# Patient Record
Sex: Female | Born: 1940
Health system: Southern US, Community
[De-identification: ages and names within clinical notes are randomized; demographics above are authoritative.]

## PROBLEM LIST (undated history)

## (undated) DIAGNOSIS — F32A Depression, unspecified: Secondary | ICD-10-CM

## (undated) DIAGNOSIS — E039 Hypothyroidism, unspecified: Secondary | ICD-10-CM

## (undated) DIAGNOSIS — G43109 Migraine with aura, not intractable, without status migrainosus: Secondary | ICD-10-CM

## (undated) DIAGNOSIS — M810 Age-related osteoporosis without current pathological fracture: Secondary | ICD-10-CM

## (undated) DIAGNOSIS — I219 Acute myocardial infarction, unspecified: Secondary | ICD-10-CM

## (undated) DIAGNOSIS — F329 Major depressive disorder, single episode, unspecified: Secondary | ICD-10-CM

## (undated) DIAGNOSIS — B029 Zoster without complications: Secondary | ICD-10-CM

## (undated) DIAGNOSIS — H269 Unspecified cataract: Secondary | ICD-10-CM

## (undated) DIAGNOSIS — E079 Disorder of thyroid, unspecified: Secondary | ICD-10-CM

## (undated) DIAGNOSIS — I2510 Atherosclerotic heart disease of native coronary artery without angina pectoris: Secondary | ICD-10-CM

## (undated) DIAGNOSIS — T7840XA Allergy, unspecified, initial encounter: Secondary | ICD-10-CM

## (undated) DIAGNOSIS — E7800 Pure hypercholesterolemia, unspecified: Secondary | ICD-10-CM

## (undated) DIAGNOSIS — S72009A Fracture of unspecified part of neck of unspecified femur, initial encounter for closed fracture: Secondary | ICD-10-CM

## (undated) HISTORY — DX: Migraine with aura, not intractable, without status migrainosus: G43.109

## (undated) HISTORY — DX: Pure hypercholesterolemia, unspecified: E78.00

## (undated) HISTORY — DX: Allergy, unspecified, initial encounter: T78.40XA

## (undated) HISTORY — DX: Age-related osteoporosis without current pathological fracture: M81.0

## (undated) HISTORY — DX: Zoster without complications: B02.9

## (undated) HISTORY — PX: DILATION AND CURETTAGE OF UTERUS: SHX78

## (undated) HISTORY — PX: OTHER SURGICAL HISTORY: SHX169

## (undated) HISTORY — DX: Atherosclerotic heart disease of native coronary artery without angina pectoris: I25.10

## (undated) HISTORY — DX: Unspecified cataract: H26.9

## (undated) HISTORY — DX: Major depressive disorder, single episode, unspecified: F32.9

## (undated) HISTORY — DX: Depression, unspecified: F32.A

## (undated) HISTORY — DX: Acute myocardial infarction, unspecified: I21.9

## (undated) HISTORY — PX: WISDOM TOOTH EXTRACTION: SHX21

## (undated) HISTORY — DX: Disorder of thyroid, unspecified: E07.9

## (undated) HISTORY — DX: Hypothyroidism, unspecified: E03.9

## (undated) HISTORY — DX: Fracture of unspecified part of neck of unspecified femur, initial encounter for closed fracture: S72.009A

---

## 1998-06-20 ENCOUNTER — Other Ambulatory Visit: Admission: RE | Admit: 1998-06-20 | Discharge: 1998-06-20 | Payer: Self-pay | Admitting: Obstetrics and Gynecology

## 1999-06-18 ENCOUNTER — Other Ambulatory Visit: Admission: RE | Admit: 1999-06-18 | Discharge: 1999-06-18 | Payer: Self-pay | Admitting: Obstetrics and Gynecology

## 2000-06-17 ENCOUNTER — Other Ambulatory Visit: Admission: RE | Admit: 2000-06-17 | Discharge: 2000-06-17 | Payer: Self-pay | Admitting: Obstetrics and Gynecology

## 2001-07-17 ENCOUNTER — Other Ambulatory Visit: Admission: RE | Admit: 2001-07-17 | Discharge: 2001-07-17 | Payer: Self-pay | Admitting: Obstetrics and Gynecology

## 2002-07-30 ENCOUNTER — Other Ambulatory Visit: Admission: RE | Admit: 2002-07-30 | Discharge: 2002-07-30 | Payer: Self-pay | Admitting: Obstetrics and Gynecology

## 2003-08-01 ENCOUNTER — Other Ambulatory Visit: Admission: RE | Admit: 2003-08-01 | Discharge: 2003-08-01 | Payer: Self-pay | Admitting: Obstetrics and Gynecology

## 2004-07-22 ENCOUNTER — Ambulatory Visit (HOSPITAL_COMMUNITY): Admission: RE | Admit: 2004-07-22 | Discharge: 2004-07-22 | Payer: Self-pay | Admitting: Gastroenterology

## 2004-08-05 ENCOUNTER — Other Ambulatory Visit: Admission: RE | Admit: 2004-08-05 | Discharge: 2004-08-05 | Payer: Self-pay | Admitting: *Deleted

## 2004-08-30 HISTORY — PX: CARDIAC CATHETERIZATION: SHX172

## 2004-09-01 ENCOUNTER — Other Ambulatory Visit: Admission: RE | Admit: 2004-09-01 | Discharge: 2004-09-01 | Payer: Self-pay | Admitting: Obstetrics and Gynecology

## 2005-03-30 DIAGNOSIS — I219 Acute myocardial infarction, unspecified: Secondary | ICD-10-CM

## 2005-03-30 HISTORY — DX: Acute myocardial infarction, unspecified: I21.9

## 2006-12-19 ENCOUNTER — Other Ambulatory Visit: Admission: RE | Admit: 2006-12-19 | Discharge: 2006-12-19 | Payer: Self-pay | Admitting: Obstetrics & Gynecology

## 2007-04-29 ENCOUNTER — Ambulatory Visit: Payer: Self-pay | Admitting: *Deleted

## 2007-04-29 ENCOUNTER — Encounter: Payer: Self-pay | Admitting: Emergency Medicine

## 2007-04-30 ENCOUNTER — Ambulatory Visit: Payer: Self-pay | Admitting: Cardiology

## 2007-04-30 ENCOUNTER — Inpatient Hospital Stay (HOSPITAL_COMMUNITY): Admission: EM | Admit: 2007-04-30 | Discharge: 2007-05-05 | Payer: Self-pay | Admitting: Internal Medicine

## 2007-05-04 ENCOUNTER — Encounter: Payer: Self-pay | Admitting: Cardiology

## 2007-05-25 ENCOUNTER — Ambulatory Visit: Payer: Self-pay | Admitting: Cardiology

## 2007-06-01 ENCOUNTER — Encounter (HOSPITAL_COMMUNITY): Admission: RE | Admit: 2007-06-01 | Discharge: 2007-08-30 | Payer: Self-pay | Admitting: Cardiology

## 2007-06-19 ENCOUNTER — Ambulatory Visit: Payer: Self-pay

## 2007-06-19 LAB — CONVERTED CEMR LAB
ALT: 32 units/L (ref 0–35)
AST: 34 units/L (ref 0–37)
Albumin: 4.2 g/dL (ref 3.5–5.2)
HDL: 65.1 mg/dL (ref >39.0)
LDL Cholesterol: 67 mg/dL (ref 0–99)
Lymphocytes Relative: 27.7 % (ref 12.0–46.0)
MCV: 87.8 fL (ref 78.0–100.0)
RDW: 11.6 % (ref 11.5–14.6)
Total Bilirubin: 0.8 mg/dL (ref 0.3–1.2)
Total CHOL/HDL Ratio: 2.2
VLDL: 9 mg/dL (ref 0–40)

## 2007-06-23 ENCOUNTER — Ambulatory Visit: Payer: Self-pay | Admitting: Cardiology

## 2007-07-14 ENCOUNTER — Ambulatory Visit: Payer: Self-pay | Admitting: Cardiology

## 2007-08-18 ENCOUNTER — Ambulatory Visit: Payer: Self-pay | Admitting: Cardiology

## 2007-08-31 ENCOUNTER — Encounter (HOSPITAL_COMMUNITY): Admission: RE | Admit: 2007-08-31 | Discharge: 2007-09-15 | Payer: Self-pay | Admitting: Cardiology

## 2007-12-11 ENCOUNTER — Ambulatory Visit: Payer: Self-pay | Admitting: Cardiology

## 2007-12-20 ENCOUNTER — Other Ambulatory Visit: Admission: RE | Admit: 2007-12-20 | Discharge: 2007-12-20 | Payer: Self-pay | Admitting: Obstetrics and Gynecology

## 2008-01-23 ENCOUNTER — Ambulatory Visit: Payer: Self-pay | Admitting: Cardiology

## 2008-02-16 ENCOUNTER — Ambulatory Visit: Payer: Self-pay | Admitting: Cardiology

## 2008-02-16 LAB — CONVERTED CEMR LAB
AST: 28 units/L (ref 0–37)
Albumin: 3.9 g/dL (ref 3.5–5.2)
Bilirubin, Direct: 0.1 mg/dL (ref 0.0–0.3)
Cholesterol: 141 mg/dL (ref 0–200)
HDL: 61.2 mg/dL (ref >39.0)
LDL Cholesterol: 71 mg/dL (ref 0–99)
Total Bilirubin: 0.8 mg/dL (ref 0.3–1.2)
Total CHOL/HDL Ratio: 2.3
VLDL: 8 mg/dL (ref 0–40)

## 2008-08-02 ENCOUNTER — Ambulatory Visit: Payer: Self-pay | Admitting: Cardiology

## 2008-12-19 ENCOUNTER — Encounter: Admission: RE | Admit: 2008-12-19 | Discharge: 2008-12-19 | Payer: Self-pay | Admitting: Internal Medicine

## 2008-12-23 ENCOUNTER — Other Ambulatory Visit: Admission: RE | Admit: 2008-12-23 | Discharge: 2008-12-23 | Payer: Self-pay | Admitting: Obstetrics & Gynecology

## 2008-12-31 ENCOUNTER — Encounter: Admission: RE | Admit: 2008-12-31 | Discharge: 2008-12-31 | Payer: Self-pay | Admitting: Obstetrics and Gynecology

## 2009-01-30 DIAGNOSIS — E7800 Pure hypercholesterolemia, unspecified: Secondary | ICD-10-CM

## 2009-01-30 DIAGNOSIS — I2510 Atherosclerotic heart disease of native coronary artery without angina pectoris: Secondary | ICD-10-CM | POA: Insufficient documentation

## 2009-01-31 ENCOUNTER — Ambulatory Visit: Payer: Self-pay | Admitting: Cardiology

## 2009-05-06 ENCOUNTER — Ambulatory Visit: Payer: Self-pay | Admitting: Cardiology

## 2009-05-16 LAB — CONVERTED CEMR LAB
ALT: 20 units/L (ref 0–35)
AST: 22 units/L (ref 0–37)
Alkaline Phosphatase: 33 units/L — ABNORMAL LOW (ref 39–117)
Total Bilirubin: 0.7 mg/dL (ref 0.3–1.2)
Total CHOL/HDL Ratio: 3

## 2009-05-19 ENCOUNTER — Telehealth: Payer: Self-pay | Admitting: Cardiology

## 2009-06-30 ENCOUNTER — Ambulatory Visit: Payer: Self-pay | Admitting: Cardiology

## 2009-07-02 ENCOUNTER — Ambulatory Visit: Payer: Self-pay | Admitting: Cardiology

## 2009-07-04 LAB — CONVERTED CEMR LAB
AST: 23 units/L (ref 0–37)
Alkaline Phosphatase: 38 units/L — ABNORMAL LOW (ref 39–117)
Cholesterol: 134 mg/dL (ref 0–200)
HDL: 61.4 mg/dL (ref >39.00)
Total Bilirubin: 0.9 mg/dL (ref 0.3–1.2)
Total Protein: 6.9 g/dL (ref 6.0–8.3)

## 2009-08-14 ENCOUNTER — Ambulatory Visit: Payer: Self-pay | Admitting: Cardiology

## 2009-08-19 LAB — CONVERTED CEMR LAB
ALT: 20 units/L (ref 0–35)
Albumin: 3.9 g/dL (ref 3.5–5.2)
Bilirubin, Direct: 0.1 mg/dL (ref 0.0–0.3)
LDL Cholesterol: 80 mg/dL (ref 0–99)
Total Bilirubin: 0.8 mg/dL (ref 0.3–1.2)
Total CHOL/HDL Ratio: 2
Total Protein: 6.8 g/dL (ref 6.0–8.3)

## 2009-09-08 ENCOUNTER — Encounter: Payer: Self-pay | Admitting: Cardiology

## 2009-12-22 ENCOUNTER — Encounter: Admission: RE | Admit: 2009-12-22 | Discharge: 2009-12-22 | Payer: Self-pay | Admitting: Obstetrics & Gynecology

## 2010-01-05 ENCOUNTER — Ambulatory Visit: Payer: Self-pay | Admitting: Cardiology

## 2010-09-04 ENCOUNTER — Encounter: Payer: Self-pay | Admitting: Cardiology

## 2010-10-01 NOTE — Assessment & Plan Note (Signed)
Summary: f53m   Visit Type:  6 months follow up Primary Provider:  Felipa Eth, MD  CC:  No complains.  History of Present Illness: Doing quite well. .  No chest pain.  Tolerating medications well without difficulty.  LDL at 80 with HDL moderately high.  Options discussed and reviewed in detail.    Current Medications (verified): 1)  Multivitamins  Tabs (Multiple Vitamin) .... Take 1 Tablet By Mouth Once A Day 2)  Calcium For Women 500-100-40 Chew (Calcium-Vitamin D-Vitamin K) .... 3 Tabs Daily 3)  Lexapro 10 Mg Tabs (Escitalopram Oxalate) .... Take One Tablet By Mouth Once Daily. 4)  Vitamin D 2 1.25mg  (50,000) Un .... Take One Every Other Week 5)  Aspirin 81 Mg Tbec (Aspirin) .... Take One Tablet By Mouth Daily 6)  Fish Oil  Oil (Fish Oil) .... Twotsp Daily 7)  Nitroglycerin 0.4 Mg Subl (Nitroglycerin) .... One Tablet Under Tongue Every 5 Minutes As Needed For Chest Pain---May Repeat Times Three 8)  Pravastatin Sodium 20 Mg Tabs (Pravastatin Sodium) .... Take One Tablet By Mouth Daily At Bedtime 9)  Probiotic  Caps (Probiotic Product) .... One Capsule Daily  Allergies: 1)  ! Prednisone 2)  ! Darvon 3)  ! Septra  Vital Signs:  Patient profile:   70 year old female Height:      66 inches Weight:      113.25 pounds BMI:     18.35 Pulse rate:   65 / minute Pulse rhythm:   irregular Resp:     18 per minute BP sitting:   128 / 70  (left arm) Cuff size:   regular  Vitals Entered By: Vikki Ports (Jan 05, 2010 11:34 AM)  Physical Exam  General:  Well developed, well nourished, in no acute distress. Head:  normocephalic and atraumatic Eyes:  PERRLA/EOM intact; conjunctiva and lids normal. Lungs:  Clear bilaterally to auscultation and percussion. Heart:  Normal S1 and S2.  Without murmur or rub.  No gallop.  Extremities:  No clubbing or cyanosis. Neurologic:  Alert and oriented x 3.   EKG  Procedure date:  01/05/2010  Findings:      NSR.  RBBB.  RAE..  Impression &  Recommendations:  Problem # 1:  CAD, UNSPECIFIED SITE (ICD-414.00) stable at current time.  No recurrent symptoms, and doing well.  Continue medical therapy at this point in time.   Her updated medication list for this problem includes:    Aspirin 81 Mg Tbec (Aspirin) .Marland Kitchen... Take one tablet by mouth daily    Nitroglycerin 0.4 Mg Subl (Nitroglycerin) ..... One tablet under tongue every 5 minutes as needed for chest pain---may repeat times three  Orders: EKG w/ Interpretation (93000)  Problem # 2:  HYPERCHOLESTEROLEMIA  IIA (ICD-272.0) LDL at 80mg .  Goals of treatment discussed and reviewed.  Given findings, continue at current dose  (problems with prior statin) Her updated medication list for this problem includes:    Pravastatin Sodium 20 Mg Tabs (Pravastatin sodium) .Marland Kitchen... Take one tablet by mouth daily at bedtime  Orders: EKG w/ Interpretation (93000)  Patient Instructions: 1)  Your physician recommends that you continue on your current medications as directed. Please refer to the Current Medication list given to you today. 2)  Your physician wants you to follow-up in: 1YEAR.  You will receive a reminder letter in the mail two months in advance. If you don't receive a letter, please call our office to schedule the follow-up appointment.

## 2010-10-01 NOTE — Letter (Signed)
Summary: [facility] Office Note  [facility] Office Note   Imported By: Roderic Ovens 10/24/2009 15:19:11  _____________________________________________________________________  External Attachment:    Type:   Image     Comment:   External Document

## 2010-11-27 ENCOUNTER — Other Ambulatory Visit: Payer: Self-pay | Admitting: Certified Nurse Midwife

## 2010-11-27 DIAGNOSIS — Z1231 Encounter for screening mammogram for malignant neoplasm of breast: Secondary | ICD-10-CM

## 2010-12-24 ENCOUNTER — Ambulatory Visit
Admission: RE | Admit: 2010-12-24 | Discharge: 2010-12-24 | Disposition: A | Discharge status: Home / Self Care | Payer: Medicare Other | Source: Ambulatory Visit | Attending: Certified Nurse Midwife | Admitting: Certified Nurse Midwife

## 2010-12-24 DIAGNOSIS — Z1231 Encounter for screening mammogram for malignant neoplasm of breast: Secondary | ICD-10-CM

## 2011-01-12 NOTE — Cardiovascular Report (Signed)
Savannah Freeman, Savannah Freeman              ACCOUNT NO.:  [id number]   MEDICAL RECORD NO.:  [id number]          PATIENT TYPE:  INP   LOCATION:  2915                         FACILITY:  [facility]   PHYSICIAN:  Savannah Morton. 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  She has marked EKG changes in the inferolateral  leads.  The current study was done to assess coronary anatomy.  Risks,  benefits and alternatives were discussed with the patient and her  husband prior to the procedure and she was brought to the  catheterization laboratory for further evaluation.   PROCEDURE:  1. Left heart catheterization.  2. Selective coronary arteriography.  3. Selective left ventriculography.  4. Aortic root aortography.   DESCRIPTION OF PROCEDURE:  The patient was brought to the  catheterization laboratory and prepped and draped in the usual fashion.  Through an anterior puncture the right femoral artery was easily  entered.  A 5-French sheath was placed.  We did give her 1 mg of  intravenous Versed for relaxation.  Following this, views of the left  coronary arteries were obtained in multiple angiographic projections.  Specifically, there was an area of 180-degree bend in the mid vessel  which was carefully looked at.  Multiple views were obtained to try to  lay this out.  The right coronary artery was engaged with a JR-3.5  catheter and a Noto did not fit well.  Central aortic and left  ventricular pressures were measured with a pigtail.  Ventriculography  was performed in the RAO projection.  Following a pressure pullback,  proximal root aortography was performed as well.  She tolerated the  procedure well and was taken to the holding area in satisfactory  clinical condition.  An ACT was checked to make sure that  sheath removal  was feasible.   I then reviewed the films with the patient's husband in the laboratory.  There were no complications.   HEMODYNAMIC DATA:  1. Central aortic pressure 127/69, mean 94.  2. Left ventricular pressure 129/23.  3. No gradient on pullback across aortic valve.   ANGIOGRAPHIC DATA:  1. On plain fluoroscopy there was no evidence of significant      calcification.  2. Ventriculography was done in the RAO projection.  There was a      anteroapical and distal inferior wall motion abnormality.  This      could represent a takotsubo, or could be related to the distal LAD.      Overall ejection fraction would be estimated in the 40% range.  3. The aortic root does not demonstrate evidence of aortic      regurgitation or significant aortic dilatation.  4. The left main is free of critical disease.  5. The left anterior descending artery demonstrates about a 10-20%      area of narrowing at the proximal aspect.  In the distal vessel      well beyond the  diagonal there is an area of 50-60% narrowing and      this occurs at a location of a 180-degree bend in the artery.  This      could represent potentially a ruptured plaque but we tried to lay      it out in multiple views and in the best views it did not appear to      be disrupted.  The distal LAD wrapped the apex and provided the      distal inferior wall.  6. The circumflex was a relatively small system that appeared to be      free of significant disease.  7. The right coronary artery is relatively small system that also      appeared to be free of significant disease.   CONCLUSIONS:  1. Moderate reduction in left ventricular function with an      anteroapical distal inferior wall motion abnormality.  2. Overall smooth coronary vessels with abnormality in the distal left      anterior descending artery as noted above.   DISCUSSION:  The distal LAD demonstrates an area of 180-degree bend.  There is marked  tortuosity at a location that could represent a 50-60%  area of focal narrowing.  In the best views, this does not appear to be  critical or hazy.  It is foreshortened in more of the LAO views.  In  either situation this could be treated as takotsubo, or could in fact be  treated as a distal LAD ruptured plaque.  The LAD lesion clearly is not  favorable for percutaneous intervention.  A medical approach to therapy  initially would be recommended.  I will review this with my colleagues.      Savannah Morton. Riley Kill, MD, [facility]  Electronically Signed     TDS/MEDQ  D:  05/02/2007  T:  05/02/2007  Job:  [id number]   cc:   Larina Earthly, M.D.  Rollene Rotunda, MD, [facility]  CV Laboratory

## 2011-01-12 NOTE — Procedures (Signed)
[name]                              EXERCISE TREADMILL   [name], [name]                     MRN:          [id number]  DATE:06/19/2007                            DOB:          1941-03-28    Savannah Freeman on the Bruce protocol.  Overall exercise tolerance  was fairly good.  She completed 8 minutes and 4 seconds.  She  experienced no chest pain.  The test was terminated due to fatigue.   Her electrocardiogram demonstrated a normal sinus rhythm.  There were  fairly permanent P waves.  At maximum stress, we did not appreciate any  significant ST depression.  The study from a standpoint of ischemia was  negative.  There were no definite ST segment changes.  It was difficult  to measure her blood pressure and it really varied during the course of  the procedure with a somewhat flat response.   RISK SUMMATION:  The patient was admitted with a periapical myocardial  infarction.  She had some irregularity in the LAD, but Dr. Excell Seltzer and I  reviewed the films in detail, did not really feel that this looked like  a ruptured plaque.  The ventriculogram also had the appearance of a Tako-Tsubo.  She has  been treated medically.  She could not tolerate Plavix because of a nosebleed.  Currently, echocardiography has been repeated and her overall ejection  fraction is normal at 60%.  Dr. Myrtis Ser and I have reviewed the films  together.  At the present time, her main symptom has been modest shortness of  breath with exertion.  Importantly, her oxygen saturation throughout the  course of the test was normal.  She is on a minimal dose of Toprol.   We plan to get a hemoglobin to make sure that she is not anemic.  Her  echocardiogram also does not show RV enlargement and as noted the  saturation remained at 99-100 throughout the course of the exercise.  We  will check a  CBC and I will see her back in followup in a month.  A  lipid profile will also be  obtained.  She will remain in cardiac rehab.  She also was noted to be a little bit orthostatic in cardiac rehab and I  have reminded her that it is important to remain well hydrated before  and during the rehab process.     Arturo Morton. Riley Kill, MD, [facility]  Electronically Signed    TDS/MedQ  DD: 06/19/2007  DT: 06/19/2007  Job #: [id number]

## 2011-01-12 NOTE — Assessment & Plan Note (Signed)
[facility]                            CARDIOLOGY OFFICE NOTE   Savannah Freeman, Savannah Freeman                     MRN:          [id number]  DATE:12/11/2007                            DOB:          1940-11-10    Savannah Freeman is in for a followup visit.  She is doing really well.  The  major complaint is that of fatigue and tiredness.  She is not sleeping  well at night, and still continues to have some issues at home.  She  denies chest pain.   PHYSICAL:  Blood pressure is 120/70, pulse 58.  The lung fields are clear.  CARDIAC:  Rhythm is regular without significant murmur.  EXTREMITIES:  Reveal no edema.   EKG reveals sinus bradycardia with first-degree AV block, right atrial  enlargement, indeterminate axis, and incomplete right bundle.  The main  finding compared to previous tracings is that the T-wave inversion,  which was dramatic early on, has completely resolved.  She does have  prominent P-waves, but should be noted that the patient only weighs 108  pounds and has a very thin chest wall.   She and I have had a long ranging discussion today.  To summarize, she  is agreeable to cutting her Toprol in half from 25 mg today to 12-1/2 mg  daily with a first-degree AV block, and bradycardia, my hope is that  this will improve some of her symptoms.  She is also not sleeping well  at night.  She has tolerated the statins, and does not have specific  side effects, but she has some fear based and talking to other patients.  I will see her back in follow up soon, in 6 weeks, and we will readdress  her situation at that time.     Arturo Morton. Riley Kill, MD, [facility]  Electronically Signed    TDS/MedQ  DD: 12/11/2007  DT: 12/11/2007  Job #: [id number]

## 2011-01-12 NOTE — Assessment & Plan Note (Signed)
[facility]                            CARDIOLOGY OFFICE NOTE   NATAHLIA, HOGGARD                     MRN:          [id number]  DATE:05/25/2007                            DOB:          Apr 02, 1941    Ms. Keilman is in for followup. She recently presented to the hospital  with chest discomfort. She turned out to have fairly marked EKG changes  and positive enzymes. At catheterization, she had an LAD area distal  that was a 360 degree bend. This did not look all that tight and Dr.  Excell Seltzer and I thought based on the wall motion abnormality this likely  would represent takotsubo. She has been under a lot of marital stress  recently. She denies any exertional chest pain prior to this. She also  has a history of migraines __________ . The patient has done well since  discharge from the hospital although there has been a modest amount of  fatigue.   On examination today the blood pressure is 122/81, the pulse is 64.  LUNG FIELDS: Clear.  CARDIAC: Rhythm is regular. There is not a significant murmur.   The electrocardiogram demonstrates normal sinus rhythm. There is T wave  inversion in V3 through V6, as well as 2, 3, and aVF and slightly  prominent P waves are noted.   We reviewed her films in the office today. Left ventricular function was  down with this apical wall motion abnormality. In addition, we did do an  aortic route to make sure she did not have an anomalus circumflex  vessel.   The patient is doing well on a medical regimen. She will continue on:  1. Toprol XL 25 mg.  2. Isosorbide mononitrate.  She also remains on aspirin. She did develop a nose bleed on Plavix and  this has been held. She also stopped simvastatin because of what she  described as ringing in the ears, but I have encouraged her to resume  this at 20 mg daily. I plan to see her back in follow up in 2 weeks at  which time an exercise treadmill study will be  performed.     Arturo Morton. Riley Kill, MD, [facility]  Electronically Signed    TDS/MedQ  DD: 05/25/2007  DT: 05/25/2007  Job #: [id number]

## 2011-01-12 NOTE — Letter (Signed)
Jan 23, 2008    Larina Earthly, M.D.  [address]  [place], [place] [address]   RE:  [name], [name]  MRN:  [id number]  /  DOB:  November 21, 1940   Dear Dr. Felipa Eth:   I had the pleasure of seeing Savannah Freeman in the office today in  follow-up.  She is scheduled to go to [place] in the next couple of  weeks.  Cardiac-wise, she seems to be getting along extremely well.   MEDICATIONS:  1. Enteric-coated aspirin 81 mg daily.  2. Multivitamin daily.  3. Simvastatin 20 mg daily.  4. Fish oil daily.  5. Calcium 600 mg b.i.d.  6. Toprol XL 25 mg 1/2 tablet daily.  7. Vitamin D 50,000 international units weekly.   On physical, she is alert and oriented in no distress.  Blood pressure  is 134/76, pulse is 62.  The lung fields are clear.  The cardiac rhythm  is regular.   Her electrocardiogram demonstrates normal sinus rhythm.  There are  prominent P-waves suggesting a pulmonary pattern, but importantly the  patient has a fairly good looking echocardiogram.  As a correlate, she  is quite thin.   I am quite pleased with how she is doing.  I hope she will do well in  [place], and I do not have any reason to believe otherwise.  We plan to  see her back in follow-up in 6 months and will get a lipid and liver  profile at some point.  Thanks for allowing me to share in her care.   ADDENDUM:  EKG reveals normal sinus rhythm and there are prominent P-  waves, suggesting right atrial enlargement.  This may be due to body  habitus.    Sincerely,      Arturo Morton. Riley Kill, MD, [facility]  Electronically Signed    TDS/MedQ  DD: 01/23/2008  DT: 01/23/2008  Job #: [id number]

## 2011-01-12 NOTE — H&P (Signed)
NAMEANNSLEE, TERCERO              ACCOUNT NO.:  [id number]   MEDICAL RECORD NO.:  [id number]          PATIENT TYPE:  EMS   LOCATION:  ED                           [facility]ACILITY:  [facility]   PHYSICIAN:  Rod Holler, MD     DATE OF BIRTH:  1940-12-22   DATE OF ADMISSION:  04/29/2007  DATE OF DISCHARGE:                              HISTORY & PHYSICAL   CHIEF COMPLAINT:  Chest pain.   HISTORY OF PRESENT ILLNESS:  Ms. Sibrian is a very pleasant 70 year old  female with no significant past medical history who presented to the  emergency department with complaints of chest discomfort.  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  Later in the evening, there was some  radiation to her bilateral shoulders.  She had no associated shortness  of breath, nausea or diaphoresis.  The discomfort improved with rest and  improved more with morphine which was given in the emergency department.  The chest discomfort did not improve with sublingual nitroglycerin x3.  The patient has never had chest discomfort before.  She is a fairly  active lady.  She had no complaints of PND, orthopnea, no lower  extremity swelling, no syncope or presyncope, no palpitations.  In the  emergency department, the patient was given sublingual nitroglycerin,  aspirin and morphine.   PAST MEDICAL HISTORY:  None.   MEDICATIONS:  1. Aspirin 81 mg p.o. daily.  2. Calcium.  3. Fish oil.   ALLERGIES:  1. DARVON.  2. PREDNISONE.  3. SEPTRA.   SOCIAL HISTORY:  The patient has a remote smoking history, is married.   FAMILY HISTORY:  Mother with history of coronary artery disease and a  stroke in her 85s.   REVIEW OF SYSTEMS:  All systems reviewed in detail and are negative  except as noted in the history of present illness.   PHYSICAL EXAMINATION:  VITAL SIGNS:  Temperature 97.3.  Heart rate in  the 60s.  Respiratory rate 17.  Temperature 98.4.  __________ Initial  blood pressure 163/98, repeat blood pressure 116/73.  GENERAL:  Thin female, alert and oriented x3.  No apparent distress.  HEENT:  Atraumatic, normocephalic.  Pupils equally round and reactive to  light and accommodation.  Extraocular movements intact.  Oropharynx  clear.  NECK:  Supple.  No adenopathy, no JVD, no carotid bruits.  CHEST:  Lungs clear to auscultation bilaterally with equal bilateral  breath sounds.  CARDIOVASCULAR:  Regular rhythm with a normal rate, normal S1, S2.  No  murmurs, rubs or gallops, 2+ peripheral pulses.  ABDOMEN:  Soft, nontender, nondistended.  Active bowel sounds.  EXTREMITIES:  No clubbing, cyanosis or edema.  NEUROLOGIC:  No focal deficits.   LABORATORY DATA:  White blood cell count 8.5, hematocrit 40.3, platelet  count 319, sodium 138, potassium 4.2, chloride 103, bicarb 26, BUN 13,  creatinine 0.7, glucose 110, total bilirubin 0.9, alk phos 44, SGOT 31,  SGPT 25, total protein 10.3, albumin 4.3, calcium 9.9, D-dimer less than  0.22, CK-MB  of 2, troponin 0.08, Myoglobin 108.  EKG shows normal sinus  rhythm with nonspecific ST changes.  No previous EKG available.   IMPRESSION AND PLAN:  A 70 year old female with no medical problems who  presents with chest pain syndrome, elevated initial troponin, symptoms  worrisome for acute coronary syndrome.   PLAN:  1. Cardiovascular.  Admit the patient to [facility] telemetry bed,      aspirin daily, Lipitor daily, ACE inhibitor, beta blocker b.i.d.,      we will start heparin bolus and drip per the pharmacy protocol,      serial cardiac enzymes daily, EKG, blood panel and BNP in the      morning.  2. Pulmonary.  Follow up chest x-ray.  3. Fluids, electrolytes and nutrition:  Cardiac diet, BMP and      magnesium level in the morning.  4. GI.  Guaiac all stools.      Rod Holler, MD  Electronically Signed     TRK/MEDQ  D:  04/29/2007  T:  04/30/2007  Job:  [id number]

## 2011-01-12 NOTE — Assessment & Plan Note (Signed)
[facility]                            CARDIOLOGY OFFICE NOTE   JOHNDA, BILLIOT                     MRN:          [id number]  DATE:08/18/2007                            DOB:          June 06, 1941    Amberle returns today in followup.  In general she is doing quite well.  We discontinued her isosorbide mononitrate recently.  Since that time  she has had no migraines and no significant chest pain, her shortness of  breath has substantially improved and she does play wind instruments so  she has noticed a substantial recovery.  She said she is back towards  normal now, and is using coffee in the morning, she has had some  terrible allergies.   PHYSICAL:  The blood pressure is 120/68, the pulse is 64.  LUNGS:  Fields are clear.  CARDIAC:  Rhythm is regular.   IMPRESSION:  1. History of mild coronary artery plaquing with concern for possible      Tako-Tsubo presentation as well, with eventual recovery of overall      left ventricular function.  2. Hypercholesterolemia on lipid lowering therapy.   PLAN:  1. Return to clinic in 4 months.  2. Continue current medical regimens.   EKG today reveals normal sinus rhythm, possible left atrial enlargement  and nonspecific T wave abnormality.     Arturo Morton. Riley Kill, MD, [facility]  Electronically Signed    TDS/MedQ  DD: 08/18/2007  DT: 08/19/2007  Job #: [id number]

## 2011-01-12 NOTE — Discharge Summary (Signed)
NAMESHANICE, Savannah Freeman              ACCOUNT NO.:  [id number]   MEDICAL RECORD NO.:  [id number]          PATIENT TYPE:  INP   LOCATION:  2027                         FACILITY:  [facility]   PHYSICIAN:  Nicolasa Ducking, ANP DATE OF BIRTH:  01/14/1941   DATE OF ADMISSION:  04/30/2007  DATE OF DISCHARGE:  05/05/2007                               DISCHARGE SUMMARY   PRIMARY CARDIOLOGIST:  Arturo Morton. Riley Kill, MD, [facility]   DISCHARGE DIAGNOSIS:  A non-ST-elevation myocardial infarction.   SECONDARY DIAGNOSES:  1. Takotsubo's cardiomyopathy.  2. Hyperlipidemia.  3. Epistaxis.   ALLERGIES:  SULFA, DARVON, PREDNISONE.   PROCEDURES:  Left heart cardiac catheterization and 2-D echocardiogram.   HISTORY OF PRESENT ILLNESS:  This is a 70 year old Caucasian female  without any significant past medical history.  She has been having some  marital difficulties which have caused a lot of in her life and was in  church playing the flute when she had sudden onset of chest pain and  pressure with radiation to bilateral shoulders without associated  symptoms.  Symptoms improved some with rest.  She presented to the  [facility] ED where she was treated with sublingual  nitroglycerin, aspirin, and morphine with significant improvement in  symptoms.  Her EKG showed nonspecific ST changes and her first set of  cardiac markers were abnormal with a CK of 94 and MB of 8, and troponin-  I of 1.94.  She was admitted to CCU for management of non-ST-elevation  MI.   HOSPITAL COURSE:  The patient peaked her MB at 11.0.  CKs remained  normal.  Troponins trended down.  She underwent left heart cardiac  catheterization on September 2 which revealed a 50-60% stenosis in the  mid LAD and otherwise nonobstructive coronary artery disease.  And  apical wall motion abnormality was noted on left ventriculography and  there was a suspicion for Takotsuto's  cardiomyopathy versus vasospasm  in the LAD.  The patient was  treated with aspirin, Plavix and Integrilin  as well as beta blocker, ACE inhibitor and statin therapy.  Unfortunately, she developed epistaxis on Integrilin and this was  discontinued.  She continued to have intermittent epistaxis and the ENT  was consulted.  Her Plavix ended up being discontinued with resolution  of epistaxis.  A followup 2-D echocardiogram was performed on September  4, and this showed an EF of 35-40% with periapical akinesis, mild  diastolic dysfunction, mild AI and a small pericardial effusion.  Dr.  Riley Kill re-reviewed the films with Dr. Tonny Bollman, and it was felt  that most likely [name]'s presentation was consistent with  Takotsubo's cardiomyopathy rather than coronary vasospasm.  She has been  ambulating in the hallways without recurrent discomfort or dyspnea.  She  has undergone education with regards to the diagnosis Takotsubo's  cardiomyopathy as well as congestive heart failure which she has not  exhibited during this admission.  She will be discharged home today in  satisfactory condition.   DISCHARGE LABORATORY DATA:  Hemoglobin 11.1, hematocrit 32.2, WBC 0.4,  platelets 224,000, MCV 87.2.  Sodium 140, potassium 2.4, chloride  110,  CO2 28, BUN 11, creatinine 0.72, glucose 97, total bilirubin 0.9,  alkaline phosphatase 44, AST 31, ALT 25, albumin 4.3, CK 107, troponin-I  0.54, total cholesterol 162, triglycerides 44, HDL 63, LDL 90, calcium  8.9, magnesium 2.2.  TSH 2.668.  BNP 651.  D-dimer was less than 0.22.   DISPOSITION:  The patient is being discharged home today in good  condition.   FOLLOW-UP APPOINTMENTS:  She has followup with Dr. Shawnie Pons on  September 25 at 4:15 p.m..   DISCHARGE MEDICATIONS:  1. Aspirin 81 mg daily.  2. Lisinopril 2.5 mg daily.  3. Toprol XL 25 mg daily.  4. Simvastatin 4 mg at bedtime.  5. Multivitamin one daily.  6. Calcium as previously prescribed.  7. Nitroglycerin 0.4 mg sublingual p.r.n. chest  pain.   STUDIES:  None.   Duration discharge encounter:  Sixty minutes including physician time.      Nicolasa Ducking, ANP     CB/MEDQ  D:  05/05/2007  T:  05/05/2007  Job:  [id number]

## 2011-01-15 ENCOUNTER — Other Ambulatory Visit: Payer: Self-pay | Admitting: Obstetrics & Gynecology

## 2011-01-15 DIAGNOSIS — Z780 Asymptomatic menopausal state: Secondary | ICD-10-CM

## 2011-01-15 NOTE — Assessment & Plan Note (Signed)
[facility]                            CARDIOLOGY OFFICE NOTE   [name], [name]                     MRN:          [id number]  DATE:08/05/2008                            DOB:          1940-12-19    Savannah Freeman in for followup.  In general, she really is doing quite well.  She  sees a therapist who thinks that she might benefit from an  antidepressant.  I encouraged her to talk to her primary care physician  about this.  She denies any cardiac symptoms at the present time and  denies any chest pain.   Her current medications include:  1. Enteric-coated aspirin 81 mg daily.  2. Multivitamin daily.  3. Simvastatin 20 mg daily.  4. Fish oil daily.  5. Calcium b.i.d.  6. Toprol-XL 25 mg one-fourth tablet daily.  7. Vitamin D two 50,000 international units per week.   On physical, she is alert and oriented.  No distress.  The blood  pressure is 134/84, the pulse is 64.  The lung fields are clear.  The  weight is 113 pounds.  Cardiac rhythm is regular.  No definite murmur is  appreciated.   The electrocardiogram demonstrates sinus rhythm with borderline first-  degree AV block.  There is an incomplete right bundle morphology.   IMPRESSION:  1. Coronary artery disease status post acute coronary syndrome to the      distal left anterior descending abnormality.  2. Hypercholesterolemia on lipid-lowering therapy.     Arturo Morton. Riley Kill, MD, [facility]  Electronically Signed    TDS/MedQ  DD: 08/05/2008  DT: 08/05/2008  Job #: [id number]

## 2011-01-15 NOTE — Op Note (Signed)
Savannah Freeman, ROPER              ACCOUNT NO.:  [id number]   MEDICAL RECORD NO.:  [id number]          PATIENT TYPE:  AMB   LOCATION:  ENDO                         FACILITY:  [facility]   PHYSICIAN:  Anselmo Rod, M.D.  DATE OF BIRTH:  02/09/1941   DATE OF PROCEDURE:  07/22/2004  DATE OF DISCHARGE:                                 OPERATIVE REPORT   PROCEDURE PERFORMED:  Screening colonoscopy.   ENDOSCOPIST:  Charna Elizabeth, M.D.   INSTRUMENT USED:  Olympus video colonoscope.   INDICATIONS FOR PROCEDURE:  The patient is a 70 year old white female  undergoing screening colonoscopy to rule out colonic polyps, masses, etc.   PREPROCEDURE PREPARATION:  Informed consent was procured from the patient.  The patient was fasted for eight hours prior to the procedure and prepped  with a bottle of magnesium citrate and a gallon of GoLYTELY the night prior  to the procedure.       A 10% miss rate for colon polyps or cancers was  discussed with the patient.   PREPROCEDURE PHYSICAL:  The patient had stable vital signs.  Neck supple.  Chest clear to auscultation.  S1 and S2 regular.  Abdomen soft with normal  bowel sounds.   DICTATION ENDED HERE.       JNM/MEDQ  D:  07/23/2004  T:  07/23/2004  Job:  [id number]   cc:   Laqueta Linden, M.D.  [address]  [place]  [place] [address]  Fax: [phone]   Larina Earthly, M.D.  [address]  [place]  [place] [address]  Fax: [phone]

## 2011-01-15 NOTE — Op Note (Signed)
Savannah Freeman, Savannah Freeman              ACCOUNT NO.:  [id number]   MEDICAL RECORD NO.:  [id number]          PATIENT TYPE:  AMB   LOCATION:  ENDO                         FACILITY:  [facility]   PHYSICIAN:  Anselmo Rod, M.D.  DATE OF BIRTH:  [birth year]   DATE OF PROCEDURE:  07/23/2004  DATE OF DISCHARGE:                                 OPERATIVE REPORT   PROCEDURE PERFORMED:  Screening colonoscopy.   ENDOSCOPIST:  Charna Elizabeth, M.D.   INSTRUMENT USED:  Olympus video colonoscope.   INDICATIONS FOR PROCEDURE:  A 70 year old white female undergoing screening  colonoscopy to rule out colonic polyps, masses, etc.   PREPROCEDURE PREPARATION:  Informed consent was procured from the patient.  The patient was fasted for eight hours prior to the procedure and prepped  with a bottle of magnesium citrate and a gallon of GoLYTELY the night prior  to the procedure.       A 10% miss rate for colon polyps or cancers was  discussed with the patient.   PREPROCEDURE PHYSICAL:  The patient had stable vital signs.  Neck supple.  Chest clear to auscultation.  S1 and S2 regular.  Abdomen soft with normal  bowel sounds.   DESCRIPTION OF PROCEDURE:  The patient was placed in left lateral decubitus  position and sedated with 60 mg of Demerol and 7.5 mg of Versed in slow  incremental doses.  Once the patient was adequately sedated and maintained  on low flow oxygen and continuous cardiac monitoring, the Olympus video  colonoscope was advanced from the rectum to the cecum.  The appendicular  orifice and ileocecal valve were clearly visualized and photographed.  No  masses, polyps, erosions, ulcerations or diverticula were seen.  Retroflexion in the rectum revealed no abnormalities.   IMPRESSION:  Normal colonoscopy up to the cecum.  No masses, polyps or  diverticula seen.   RECOMMENDATIONS:  1.  Continue high fiber diet with liberal fluid intake.  2.  Repeat colonoscopy is recommended in the next five years  unless the      patient develops any abnormal symptoms in the interim.  3.  Outpatient followup as need arises in the future.       JNM/MEDQ  D:  07/23/2004  T:  07/23/2004  Job:  [id number]   cc:   Laqueta Linden, M.D.  [address]  [place]  [place] [address]  Fax: [phone]   Larina Earthly, M.D.  [address]  [place]  [place] [address]  Fax: [phone]

## 2011-01-21 ENCOUNTER — Encounter: Payer: Self-pay | Admitting: Cardiology

## 2011-01-22 ENCOUNTER — Ambulatory Visit
Admission: RE | Admit: 2011-01-22 | Discharge: 2011-01-22 | Disposition: A | Discharge status: Home / Self Care | Payer: Medicare Other | Source: Ambulatory Visit | Attending: Obstetrics & Gynecology | Admitting: Obstetrics & Gynecology

## 2011-01-22 DIAGNOSIS — Z780 Asymptomatic menopausal state: Secondary | ICD-10-CM

## 2011-01-28 ENCOUNTER — Ambulatory Visit (INDEPENDENT_AMBULATORY_CARE_PROVIDER_SITE_OTHER): Payer: Medicare Other | Admitting: Cardiology

## 2011-01-28 ENCOUNTER — Encounter: Payer: Self-pay | Admitting: Cardiology

## 2011-01-28 DIAGNOSIS — E7800 Pure hypercholesterolemia, unspecified: Secondary | ICD-10-CM

## 2011-01-28 DIAGNOSIS — I2510 Atherosclerotic heart disease of native coronary artery without angina pectoris: Secondary | ICD-10-CM

## 2011-01-28 NOTE — Assessment & Plan Note (Signed)
Followed by Dr. Avva  

## 2011-01-28 NOTE — Assessment & Plan Note (Signed)
Completely reviewed with patient. She is doing well.  I favor continuing current medications.  We can see her again in one year.

## 2011-01-28 NOTE — Progress Notes (Signed)
HPI:  Doing very well.  No complaints.  Prior history reviewed.  No problems.  Feeling good.    Current Outpatient Prescriptions  Medication Sig Dispense Refill  . aspirin 81 MG tablet Take 81 mg by mouth daily.        . Calcium-Vitamin D-Vitamin K (CALCIUM FOR WOMEN) 500-100-40 MG-UNT-MCG CHEW Chew 3 tablets by mouth daily.        Marland Kitchen escitalopram (LEXAPRO) 10 MG tablet Take 10 mg by mouth daily.        . Fish Oil-Canola Oil-Vit D3 LIQD Take 2 mLs by mouth daily.        . Multiple Vitamin (MULTIVITAMIN) tablet Take 1 tablet by mouth daily.        . nitroGLYCERIN (NITROSTAT) 0.4 MG SL tablet Place 0.4 mg under the tongue every 5 (five) minutes as needed.        . pravastatin (PRAVACHOL) 20 MG tablet Take 20 mg by mouth daily.        . Probiotic Product (PROBIOTIC PO) Take by mouth daily.        . Vitamin D, Ergocalciferol, (DRISDOL) 50000 UNITS CAPS Take 50,000 Units by mouth every 14 (fourteen) days.          Allergies  Allergen Reactions  . Prednisone   . Propoxyphene Hcl   . Sulfamethoxazole W/Trimethoprim     Past Medical History  Diagnosis Date  . CAD (coronary artery disease)   . Hypercholesteremia     No past surgical history on file.  Family History  Problem Relation Age of Onset  . Coronary artery disease    . Stroke      History   Social History  . Marital Status: Married    Spouse Name: N/A    Number of Children: N/A  . Years of Education: N/A   Occupational History  . Not on file.   Social History Main Topics  . Smoking status: Former Games developer  . Smokeless tobacco: Not on file  . Alcohol Use: Not on file  . Drug Use: Not on file  . Sexually Active: Not on file   Other Topics Concern  . Not on file   Social History Narrative  . No narrative on file    ROS: Please see the HPI.  All other systems reviewed and negative.  PHYSICAL EXAM:  BP 128/66  Pulse 69  Resp 18  Ht 5\' 6"  (1.676 m)  Wt 114 lb 1.9 oz (51.764 kg)  BMI 18.42 kg/m2  General:  Well developed, well nourished, in no acute distress. Head:  Normocephalic and atraumatic. Neck: no JVD Lungs: Clear to auscultation and percussion. Heart: Normal S1 and S2.  No murmur, rubs or gallops. Pulses: Pulses normal in all 4 extremities. Extremities: No clubbing or cyanosis. No edema. Neurologic: Alert and oriented x 3.  EKG:  NSR.  RAE.  iRBBB.   ASSESSMENT AND PLAN:

## 2011-01-28 NOTE — Patient Instructions (Signed)
Your physician wants you to follow-up in: 1 YEAR.  You will receive a reminder letter in the mail two months in advance. If you don't receive a letter, please call our office to schedule the follow-up appointment.  Your physician recommends that you continue on your current medications as directed. Please refer to the Current Medication list given to you today.  

## 2011-03-26 ENCOUNTER — Other Ambulatory Visit: Payer: Self-pay | Admitting: Cardiology

## 2011-06-11 LAB — CBC
HCT: 33 — ABNORMAL LOW
HCT: 38.2
HCT: 40.3
MCHC: 34.5
MCHC: 35
MCHC: 34.6
MCV: 86
MCV: 87.2
MCV: 88.2
MCV: 86.7
Platelets: 191
Platelets: 224
Platelets: 237
RBC: 4.65
RDW: 12.6
RDW: 12.6
RDW: 12.7
RDW: 12.4
WBC: 8.4

## 2011-06-11 LAB — BASIC METABOLIC PANEL
BUN: 13
CO2: 28
CO2: 30
Calcium: 8.9
Creatinine, Ser: 0.65
Creatinine, Ser: 0.72
GFR calc non Af Amer: >60
GFR calc non Af Amer: >60
Glucose, Bld: 102 — ABNORMAL HIGH
Glucose, Bld: 92
Glucose, Bld: 97
Potassium: 3.8
Sodium: 141

## 2011-06-11 LAB — DIFFERENTIAL
Basophils Absolute: 0.1
Basophils Absolute: 0.2 — ABNORMAL HIGH
Eosinophils Absolute: 0.6
Eosinophils Relative: 9 — ABNORMAL HIGH
Lymphocytes Relative: 37
Lymphocytes Relative: 32
Lymphs Abs: 2.8
Neutro Abs: 4.2
Neutrophils Relative %: 50

## 2011-06-11 LAB — CARDIAC PANEL(CRET KIN+CKTOT+MB+TROPI)
CK, MB: 11 — ABNORMAL HIGH
Relative Index: 9.6 — ABNORMAL HIGH
Total CK: 102
Troponin I: 1.48

## 2011-06-11 LAB — HEPARIN LEVEL (UNFRACTIONATED)
Heparin Unfractionated: 0.29 — ABNORMAL LOW
Heparin Unfractionated: 0.32
Heparin Unfractionated: <0.1 — ABNORMAL LOW

## 2011-06-11 LAB — COMPREHENSIVE METABOLIC PANEL
AST: 31
BUN: 13
CO2: 26
Calcium: 9.9
Creatinine, Ser: 0.68
GFR calc Af Amer: >60
GFR calc non Af Amer: >60
Glucose, Bld: 110 — ABNORMAL HIGH

## 2011-06-11 LAB — APTT: aPTT: 27

## 2011-06-11 LAB — POCT CARDIAC MARKERS
CKMB, poc: 2
CKMB, poc: 3.4
Myoglobin, poc: 108
Myoglobin, poc: 68
Operator id: 1192
Troponin i, poc: 0.08 — ABNORMAL HIGH
Troponin i, poc: 0.59

## 2011-06-11 LAB — PROTIME-INR: INR: 1.1

## 2011-06-11 LAB — LIPID PANEL
Cholesterol: 162
LDL Cholesterol: 90
Total CHOL/HDL Ratio: 2.6
Triglycerides: 44

## 2011-06-11 LAB — D-DIMER, QUANTITATIVE: D-Dimer, Quant: <0.22

## 2011-06-11 LAB — TROPONIN I
Troponin I: 0.54
Troponin I: 1.94

## 2011-11-19 ENCOUNTER — Other Ambulatory Visit: Payer: Self-pay | Admitting: Internal Medicine

## 2011-11-19 DIAGNOSIS — Z1231 Encounter for screening mammogram for malignant neoplasm of breast: Secondary | ICD-10-CM

## 2011-12-22 ENCOUNTER — Other Ambulatory Visit: Payer: Self-pay | Admitting: Cardiology

## 2011-12-28 ENCOUNTER — Ambulatory Visit
Admission: RE | Admit: 2011-12-28 | Discharge: 2011-12-28 | Disposition: A | Discharge status: Home / Self Care | Payer: Medicare Other | Source: Ambulatory Visit | Attending: Internal Medicine | Admitting: Internal Medicine

## 2011-12-28 DIAGNOSIS — Z1231 Encounter for screening mammogram for malignant neoplasm of breast: Secondary | ICD-10-CM

## 2012-02-07 ENCOUNTER — Other Ambulatory Visit: Payer: Self-pay | Admitting: *Deleted

## 2012-02-07 ENCOUNTER — Encounter: Payer: Self-pay | Admitting: Cardiology

## 2012-02-07 ENCOUNTER — Ambulatory Visit (INDEPENDENT_AMBULATORY_CARE_PROVIDER_SITE_OTHER): Payer: Medicare Other | Admitting: Cardiology

## 2012-02-07 VITALS — BP 118/64 | HR 68 | Ht 65.75 in | Wt 118.0 lb

## 2012-02-07 DIAGNOSIS — E785 Hyperlipidemia, unspecified: Secondary | ICD-10-CM

## 2012-02-07 DIAGNOSIS — E7800 Pure hypercholesterolemia, unspecified: Secondary | ICD-10-CM

## 2012-02-07 DIAGNOSIS — R9431 Abnormal electrocardiogram [ECG] [EKG]: Secondary | ICD-10-CM | POA: Insufficient documentation

## 2012-02-07 DIAGNOSIS — I2510 Atherosclerotic heart disease of native coronary artery without angina pectoris: Secondary | ICD-10-CM

## 2012-02-07 MED ORDER — NITROGLYCERIN 0.4 MG SL SUBL: 0.4000 mg | SUBLINGUAL_TABLET | SUBLINGUAL | Status: DC | PRN

## 2012-02-07 NOTE — Patient Instructions (Signed)
Your physician wants you to follow-up in: 6 MONTHS You will receive a reminder letter in the mail two months in advance. If you don't receive a letter, please call our office to schedule the follow-up appointment. 

## 2012-02-07 NOTE — Assessment & Plan Note (Signed)
Will have lipid and liver profile with Dr. Felipa Eth.

## 2012-02-07 NOTE — Progress Notes (Signed)
HPI:  She is in for followup. She is really doing quite well. She does wonder whether the stent and causes her to have some aching in the legs as well as some memory issues. She would like to cut the dose down in half and see how she feels, and she and I had a lengthy discussion about this. She generally has relapsed and once a year, and again will have these done in Dr. Lanier Clam office in January.  No current symptoms.    Current Outpatient Prescriptions  Medication Sig Dispense Refill  . aspirin 81 MG tablet Take 81 mg by mouth daily.        . Calcium-Vitamin D-Vitamin K (CALCIUM FOR WOMEN) 500-100-40 MG-UNT-MCG CHEW Chew 3 tablets by mouth daily.        . Cholecalciferol (VITAMIN D3) 1000 UNITS CAPS Take 1,000 Units by mouth daily.      Marland Kitchen co-enzyme Q-10 30 MG capsule Take 30 mg by mouth daily.      Marland Kitchen escitalopram (LEXAPRO) 10 MG tablet Take 5 mg by mouth daily.       . Fish Oil-Canola Oil-Vit D3 LIQD Take 2 mLs by mouth daily.        Marland Kitchen levothyroxine (SYNTHROID, LEVOTHROID) 50 MCG tablet Take 50 mcg by mouth daily.      . Multiple Vitamin (MULTIVITAMIN) tablet Take 1 tablet by mouth daily.        . nitroGLYCERIN (NITROSTAT) 0.4 MG SL tablet Place 0.4 mg under the tongue every 5 (five) minutes as needed.        Marland Kitchen PRAVACHOL 20 MG tablet TAKE ONE TABLET AT BEDTIME.  30 each  2  . Red Yeast Rice 600 MG CAPS Take 600 mg by mouth daily.        Allergies  Allergen Reactions  . Prednisone   . Propoxyphene Hcl   . Sulfamethoxazole W-Trimethoprim     Past Medical History  Diagnosis Date  . CAD (coronary artery disease)   . Hypercholesteremia     No past surgical history on file.  Family History  Problem Relation Age of Onset  . Coronary artery disease    . Stroke      History   Social History  . Marital Status: Married    Spouse Name: N/A    Number of Children: N/A  . Years of Education: N/A   Occupational History  . Not on file.   Social History Main Topics  . Smoking  status: Former Games developer  . Smokeless tobacco: Not on file  . Alcohol Use: Not on file  . Drug Use: Not on file  . Sexually Active: Not on file   Other Topics Concern  . Not on file   Social History Narrative  . No narrative on file    ROS: Please see the HPI.  All other systems reviewed and negative.  PHYSICAL EXAM:  BP 118/64  Pulse 68  Ht 5' 5.75" (1.67 m)  Wt 118 lb (53.524 kg)  BMI 19.19 kg/m2  General: Well developed, well nourished, in no acute distress. Head:  Normocephalic and atraumatic. Neck: no JVD Lungs: Clear to auscultation and percussion. Heart: Normal S1 and S2.  No murmur, rubs or gallops.  Pulses: Pulses normal in all 4 extremities. Extremities: No clubbing or cyanosis. No edema. Neurologic: Alert and oriented x 3.  EKG:  NSr.  Biatrial enlargement.  Righward axis.  Delay in R wave progression.  No acute changes.   ASSESSMENT AND PLAN:

## 2012-02-07 NOTE — Assessment & Plan Note (Signed)
No current symptoms.  Continue observation.

## 2012-02-07 NOTE — Assessment & Plan Note (Signed)
Suggests atrial enlargement, but she is small---echo in 2008 was essentially normal, read by Dr. Myrtis Ser.  EF had recovered.

## 2012-08-07 ENCOUNTER — Ambulatory Visit (INDEPENDENT_AMBULATORY_CARE_PROVIDER_SITE_OTHER): Payer: Medicare Other | Admitting: Cardiology

## 2012-08-07 ENCOUNTER — Encounter: Payer: Self-pay | Admitting: Cardiology

## 2012-08-07 VITALS — BP 128/76 | HR 65 | Ht 66.0 in | Wt 118.0 lb

## 2012-08-07 DIAGNOSIS — E7800 Pure hypercholesterolemia, unspecified: Secondary | ICD-10-CM

## 2012-08-07 DIAGNOSIS — R9431 Abnormal electrocardiogram [ECG] [EKG]: Secondary | ICD-10-CM

## 2012-08-07 DIAGNOSIS — I2510 Atherosclerotic heart disease of native coronary artery without angina pectoris: Secondary | ICD-10-CM

## 2012-08-07 NOTE — Progress Notes (Signed)
   HPI:  She is in for a follow up visit.  She is not having any current symptoms.  She was unable to tolerate statin drugs.  Denies any chest pain.    Current Outpatient Prescriptions  Medication Sig Dispense Refill  . aspirin 81 MG tablet Take 81 mg by mouth daily.        . Calcium-Vitamin D-Vitamin K (CALCIUM FOR WOMEN) 500-100-40 MG-UNT-MCG CHEW Chew 3 tablets by mouth daily.        . Cholecalciferol (VITAMIN D3) 1000 UNITS CAPS Take 1,000 Units by mouth daily.      Marland Kitchen escitalopram (LEXAPRO) 10 MG tablet Take 5 mg by mouth daily.       . Fish Oil-Canola Oil-Vit D3 LIQD Take 2 mLs by mouth daily.        Marland Kitchen levothyroxine (SYNTHROID, LEVOTHROID) 50 MCG tablet Take 50 mcg by mouth daily.      . Multiple Vitamin (MULTIVITAMIN) tablet Take 1 tablet by mouth daily.        . nitroGLYCERIN (NITROSTAT) 0.4 MG SL tablet Place 1 tablet (0.4 mg total) under the tongue every 5 (five) minutes as needed.  25 tablet  2    Allergies  Allergen Reactions  . Prednisone   . Propoxyphene Hcl   . Sulfamethoxazole W-Trimethoprim     Past Medical History  Diagnosis Date  . CAD (coronary artery disease)   . Hypercholesteremia     No past surgical history on file.  Family History  Problem Relation Age of Onset  . Coronary artery disease    . Stroke      History   Social History  . Marital Status: Married    Spouse Name: N/A    Number of Children: N/A  . Years of Education: N/A   Occupational History  . Not on file.   Social History Main Topics  . Smoking status: Former Games developer  . Smokeless tobacco: Not on file  . Alcohol Use: Not on file  . Drug Use: Not on file  . Sexually Active: Not on file   Other Topics Concern  . Not on file   Social History Narrative  . No narrative on file    ROS: Please see the HPI.  All other systems reviewed and negative.  PHYSICAL EXAM:  BP 128/76  Pulse 65  Ht 5\' 6"  (1.676 m)  Wt 118 lb (53.524 kg)  BMI 19.05 kg/m2  SpO2 99%  General: Well  developed, well nourished, in no acute distress. Head:  Normocephalic and atraumatic. Neck: no JVD Lungs: Clear to auscultation and percussion. Heart: Normal S1 and S2.  No murmur, rubs or gallops.  Pulses: Pulses normal in all 4 extremities. Extremities: No clubbing or cyanosis. No edema. Neurologic: Alert and oriented x 3.  EKG:  NSR with borderline first degree av block.  Rightward axis.   ASSESSMENT AND PLAN:

## 2012-08-07 NOTE — Assessment & Plan Note (Signed)
The patient has no recurrent symptoms and she is doing extremely well. She will continue to exercise. We will arrange an appointment for her to see Dr. Clifton James back in one year.

## 2012-08-07 NOTE — Patient Instructions (Addendum)
Your physician wants you to follow-up in: 1 YEAR with Dr Earney Hamburg. You will receive a reminder letter in the mail two months in advance. If you don't receive a letter, please call our office to schedule the follow-up appointment.  Your physician recommends that you continue on your current medications as directed. Please refer to the Current Medication list given to you today.

## 2012-08-07 NOTE — Assessment & Plan Note (Signed)
The patient's currently off of statin drugs. She will continue to have lipid followup with Dr. Felipa Eth.

## 2012-12-08 ENCOUNTER — Telehealth: Payer: Self-pay | Admitting: Certified Nurse Midwife

## 2012-12-08 ENCOUNTER — Other Ambulatory Visit: Payer: Self-pay

## 2012-12-08 DIAGNOSIS — Z1231 Encounter for screening mammogram for malignant neoplasm of breast: Secondary | ICD-10-CM

## 2012-12-08 NOTE — Telephone Encounter (Signed)
LEFT MESSAGE ON CB# OF NEED TO RETURN CALL ABOUT MAMMOGRAM BEING DONE THIS YEAR. SEE PAPER CHART . Savannah Freeman

## 2012-12-08 NOTE — Telephone Encounter (Signed)
Pt got a mmg reminder notice and thought she was only to have mmg's every two years/please advise/Savannah Freeman

## 2012-12-11 NOTE — Telephone Encounter (Signed)
LEFT MESSAGE ON HOME AND CELL # TO RETURN CALL TO OUR OFFICE. SUE

## 2013-01-09 ENCOUNTER — Ambulatory Visit
Admission: RE | Admit: 2013-01-09 | Discharge: 2013-01-09 | Disposition: A | Discharge status: Home / Self Care | Payer: Medicare Other | Source: Ambulatory Visit

## 2013-01-09 DIAGNOSIS — Z1231 Encounter for screening mammogram for malignant neoplasm of breast: Secondary | ICD-10-CM

## 2013-01-15 ENCOUNTER — Encounter: Payer: Self-pay | Admitting: Certified Nurse Midwife

## 2013-01-16 ENCOUNTER — Encounter: Payer: Self-pay | Admitting: Certified Nurse Midwife

## 2013-01-16 ENCOUNTER — Ambulatory Visit (INDEPENDENT_AMBULATORY_CARE_PROVIDER_SITE_OTHER): Payer: 59 | Admitting: Certified Nurse Midwife

## 2013-01-16 VITALS — BP 128/74 | Ht 66.25 in | Wt 117.0 lb

## 2013-01-16 DIAGNOSIS — Z23 Encounter for immunization: Secondary | ICD-10-CM

## 2013-01-16 DIAGNOSIS — Z01419 Encounter for gynecological examination (general) (routine) without abnormal findings: Secondary | ICD-10-CM

## 2013-01-16 MED ORDER — TETANUS-DIPHTH-ACELL PERTUSSIS 5-2.5-18.5 LF-MCG/0.5 IM SUSP
0.5000 mL | Freq: Once | INTRAMUSCULAR | Status: AC
  Administered 2013-01-17: 0.5 mL via INTRAMUSCULAR

## 2013-01-16 NOTE — Patient Instructions (Signed)

## 2013-01-16 NOTE — Progress Notes (Signed)
72 y.o. G72P0011 Married Caucasian Fe here for annual exam.  Menopausal no HRT.  Denies vaginal bleeding or vaginal dryness. Thyroid stable on medication.  Recent visit with PCP all labs normal per patient.  No health issues today Staying active and teaching music! No LMP recorded. Patient is postmenopausal.          Sexually active: no  The current method of family planning is abstinence.    Exercising: yes  walking,yoga & exercise machines Smoker:  no  Health Maintenance: Pap:  5-10-11neg MMG:  01-09-13 normal Colonoscopy:  2005 BMD:  01-22-11 TDaP:  2003 Labs: none Self breast exams: occ   reports that she has quit smoking. She does not have any smokeless tobacco history on file. She reports that she drinks about 2.0 ounces of alcohol per week. She reports that she does not use illicit drugs.  Past Medical History  Diagnosis Date  . CAD (coronary artery disease)   . Hypercholesteremia   . Osteoporosis   . Thyroid disease     hypothyroidism  . Depression     Past Surgical History  Procedure Laterality Date  . Dilation and curettage of uterus      Current Outpatient Prescriptions  Medication Sig Dispense Refill  . aspirin 81 MG tablet Take 81 mg by mouth daily.        . Calcium-Vitamin D-Vitamin K (CALCIUM FOR WOMEN) 500-100-40 MG-UNT-MCG CHEW Chew 3 tablets by mouth daily.        . Cholecalciferol (VITAMIN D3) 1000 UNITS CAPS Take 1,000 Units by mouth daily.      Marland Kitchen escitalopram (LEXAPRO) 10 MG tablet Take 5 mg by mouth daily.       . Fish Oil-Canola Oil-Vit D3 LIQD Take 2 mLs by mouth daily.        Marland Kitchen levothyroxine (SYNTHROID, LEVOTHROID) 50 MCG tablet Take 50 mcg by mouth daily.      . Multiple Vitamin (MULTIVITAMIN) tablet Take 1 tablet by mouth daily.        . nitroGLYCERIN (NITROSTAT) 0.4 MG SL tablet Place 1 tablet (0.4 mg total) under the tongue every 5 (five) minutes as needed.  25 tablet  2   No current facility-administered medications for this visit.    Family  History  Problem Relation Age of Onset  . Coronary artery disease    . Stroke      ROS:  Pertinent items are noted in HPI.  Otherwise, a comprehensive ROS was negative.  Exam:   BP 128/74  Ht 5' 6.25" (1.683 m)  Wt 117 lb (53.071 kg)  BMI 18.74 kg/m2 Height: 5' 6.25" (168.3 cm)  Ht Readings from Last 3 Encounters:  01/16/13 5' 6.25" (1.683 m)  08/07/12 5\' 6"  (1.676 m)  02/07/12 5' 5.75" (1.67 m)    General appearance: alert, cooperative and appears stated age Head: Normocephalic, without obvious abnormality, atraumatic Neck: no adenopathy, supple, symmetrical, trachea midline and thyroid normal to inspection and palpation Lungs: clear to auscultation bilaterally Breasts: normal appearance, no masses or tenderness, No nipple retraction or dimpling, No nipple discharge or bleeding, No axillary or supraclavicular adenopathy Heart: regular rate and rhythm Abdomen: soft, non-tender; no masses,  no organomegaly Extremities: extremities normal, atraumatic, no cyanosis or edema Skin: Skin color, texture, turgor normal. No rashes or lesions Lymph nodes: Cervical, supraclavicular, and axillary nodes normal. No abnormal inguinal nodes palpated Neurologic: Grossly normal   Pelvic: External genitalia:  no lesions  Urethra:  normal appearing urethra with no masses, tenderness or lesions              Bartholin's and Skene's: normal                 Vagina: normal appearing vagina with normal color and discharge, no lesions              Cervix: normal non tender              Pap taken: no Bimanual Exam:  Uterus:  normal size, contour, position, consistency, mobility, non-tender and mid position              Adnexa: normal adnexa and no mass, fullness, tenderness               Rectovaginal: Confirms               Anus:  normal sphincter tone, no lesions  A:  Well Woman with normal exam  Menopausal no HRT  Hypothyroid stable medication with PCP management  Immunization  update P:   Reviewed health and wellness pertinent to exam  Aware of need to advise if vaginal bleeding Continue follow up with PCP  Requests but needs to schedule later not today  Pap smear as per guidelines   Mammogram yearly pap smear not taken today counseled on breast self exam, feminine hygiene, adequate intake of calcium and vitamin D, diet and exercise, Kegel's exercises  return annually or prn  An After Visit Summary was printed and given to the patient.  Reviewed, TL

## 2013-01-17 ENCOUNTER — Ambulatory Visit (INDEPENDENT_AMBULATORY_CARE_PROVIDER_SITE_OTHER): Payer: 59 | Admitting: Obstetrics and Gynecology

## 2013-01-17 VITALS — BP 106/82

## 2013-01-17 DIAGNOSIS — Z23 Encounter for immunization: Secondary | ICD-10-CM

## 2013-01-17 NOTE — Progress Notes (Signed)
Tolerated well

## 2013-01-18 NOTE — Progress Notes (Signed)
Patient seen by nurse for TDap injection.

## 2013-01-19 ENCOUNTER — Encounter: Payer: Self-pay | Admitting: Obstetrics and Gynecology

## 2013-04-04 ENCOUNTER — Other Ambulatory Visit: Payer: Self-pay

## 2013-06-29 ENCOUNTER — Telehealth: Payer: Self-pay | Admitting: *Deleted

## 2013-06-29 ENCOUNTER — Ambulatory Visit (INDEPENDENT_AMBULATORY_CARE_PROVIDER_SITE_OTHER): Payer: 59 | Admitting: Podiatrist

## 2013-06-29 ENCOUNTER — Encounter: Payer: Self-pay | Admitting: Podiatrist

## 2013-06-29 VITALS — BP 129/68 | HR 67 | Resp 12 | Ht 66.0 in | Wt 119.0 lb

## 2013-06-29 DIAGNOSIS — B351 Tinea unguium: Secondary | ICD-10-CM

## 2013-06-29 MED ORDER — EFINACONAZOLE 10 % EX SOLN: 1.0000 [drp] | Freq: Every day | CUTANEOUS | Status: DC

## 2013-06-29 NOTE — Telephone Encounter (Signed)
Pt states pharmacy says insurance will not cover Jublia, Dr Irving Shows had states another pharmacy worked with [employer] for payment.  Returned call, informed pt of [employer] and changed Jublia order to [facility].

## 2013-06-29 NOTE — Progress Notes (Signed)
Patient presents today for re\re lasering of all 10 toenails. Patient's noticed some improvement however it's been very slow. Laserering of Toenails bilateral carried out at today's visit via Q-switch YAG laser by QClear Laser at continuous Patient and staff were wearing appropriate laser protective goggles/eyewear Laser device was tested prior to use and safety protocols were followed Frequency of 5 Hz, Level 4, Joules 7.5 delivered.       The patient tolerated the lasering well and will call if any concerns arise.  I will see her back in 1-2 months for repeat treatment I also started on Jublia

## 2013-07-05 ENCOUNTER — Other Ambulatory Visit: Payer: Self-pay

## 2013-09-05 ENCOUNTER — Ambulatory Visit (INDEPENDENT_AMBULATORY_CARE_PROVIDER_SITE_OTHER): Payer: Medicare Other | Admitting: Cardiovascular Disease

## 2013-09-05 ENCOUNTER — Encounter: Payer: Self-pay | Admitting: Cardiovascular Disease

## 2013-09-05 VITALS — BP 147/70 | HR 66 | Ht 66.0 in | Wt 117.6 lb

## 2013-09-05 DIAGNOSIS — I2510 Atherosclerotic heart disease of native coronary artery without angina pectoris: Secondary | ICD-10-CM

## 2013-09-05 DIAGNOSIS — E7800 Pure hypercholesterolemia, unspecified: Secondary | ICD-10-CM

## 2013-09-05 MED ORDER — NITROGLYCERIN 0.4 MG SL SUBL: 0.4000 mg | SUBLINGUAL_TABLET | SUBLINGUAL | Status: DC | PRN

## 2013-09-05 NOTE — Patient Instructions (Signed)

## 2013-09-05 NOTE — Progress Notes (Signed)
History of Present Illness: 73 yo patient with history of CAD, HLD who is here today for cardiac follow up. She has been followed in the past by Dr. Lia Foyer. She presented with a NSTEMI in September 2008 and Dr. Lia Foyer performed cath which showed 50-60% mid LAD stenosis, Circumflex and RCA had no significant disease. She has been managed medically since then. LVEF was noted by cath to be 40% but f/u echo October 2008 with LVEF=60%, no valve disease. She has not tolerated statins due to muscle pains. She has not tolerated beta blockers due to fatigue.   She is here today for follow up.  She has been feeling well. No chest pain or SOB. No palpitations, dizziness, near syncope or syncope.   Primary Care Physician: Avva  Last Lipid Profile: Followed in primary care  Past Medical History  Diagnosis Date  . CAD (coronary artery disease)   . Hypercholesteremia   . Osteoporosis   . Thyroid disease     hypothyroidism  . Depression     Past Surgical History  Procedure Laterality Date  . Dilation and curettage of uterus    . Cardiac catheterization      Current Outpatient Prescriptions  Medication Sig Dispense Refill  . aspirin 81 MG tablet Take 81 mg by mouth daily.        . Calcium-Vitamin D-Vitamin K (CALCIUM FOR WOMEN) 500-100-40 MG-UNT-MCG CHEW Chew 2 tablets by mouth daily.       Marland Kitchen escitalopram (LEXAPRO) 10 MG tablet Take 10 mg by mouth daily.      . Fish Oil-Canola Oil-Vit D3 LIQD Take by mouth daily. 1 tsp daily      . levothyroxine (SYNTHROID, LEVOTHROID) 50 MCG tablet Take 50 mcg by mouth daily.      Marland Kitchen loratadine (CLARITIN) 10 MG tablet Take 10 mg by mouth daily.      . Multiple Vitamin (MULTIVITAMIN) tablet Take by mouth daily.       . nitroGLYCERIN (NITROSTAT) 0.4 MG SL tablet Place 1 tablet (0.4 mg total) under the tongue every 5 (five) minutes as needed.  25 tablet  2  . Probiotic Product (PROBIOTIC PO) Take by mouth daily.       No current facility-administered  medications for this visit.    Allergies  Allergen Reactions  . Darvon [Propoxyphene Hcl]   . Prednisone   . Septra [Sulfamethoxazole-Trimethoprim]     History   Social History  . Marital Status: Married    Spouse Name: N/A    Number of Children: 1  . Years of Education: N/A   Occupational History  . Part time music teacher    Social History Main Topics  . Smoking status: Former Research scientist (life sciences)  . Smokeless tobacco: Not on file  . Alcohol Use: 0.6 oz/week    1 Glasses of wine per week     Comment: every other day  . Drug Use: No  . Sexual Activity: No   Other Topics Concern  . Not on file   Social History Narrative  . No narrative on file    Family History  Problem Relation Age of Onset  . Hypertension Mother   . Stroke Mother   . Heart disease Mother   . Hypertension Maternal Aunt   . Stroke Maternal Aunt   . Kidney disease Father   . Alzheimer's disease Father     Review of Systems:  As stated in the HPI and otherwise negative.   BP 147/70  Pulse 66  Ht 5\' 6"  (1.676 m)  Wt 117 lb 9.6 oz (53.343 kg)  BMI 18.99 kg/m2  LMP 08/30/1989  Physical Examination: General: Well developed, well nourished, NAD HEENT: OP clear, mucus membranes moist SKIN: warm, dry. No rashes. Neuro: No focal deficits Musculoskeletal: Muscle strength 5/5 all ext Psychiatric: Mood and affect normal Neck: No JVD, no carotid bruits, no thyromegaly, no lymphadenopathy. Lungs:Clear bilaterally, no wheezes, rhonci, crackles Cardiovascular: Regular rate and rhythm. No murmurs, gallops or rubs. Abdomen:Soft. Bowel sounds present. Non-tender.  Extremities: No lower extremity edema. Pulses are 2 + in the bilateral DP/PT.  Cardiac cath 05/02/07:  4. The left main is free of critical disease.  5. The left anterior descending artery demonstrates about a 10-20%  area of narrowing at the proximal aspect. In the distal vessel  well beyond the diagonal there is an area of 50-60% narrowing and    this occurs at a location of a 180-degree bend in the artery. This  could represent potentially a ruptured plaque but we tried to lay  it out in multiple views and in the best views it did not appear to  be disrupted. The distal LAD wrapped the apex and provided the  distal inferior wall.  6. The circumflex was a relatively small system that appeared to be  free of significant disease.  7. The right coronary artery is relatively small system that also  appeared to be free of significant disease.  CONCLUSIONS:  1. Moderate reduction in left ventricular function with an  anteroapical distal inferior wall motion abnormality.  2. Overall smooth coronary vessels with abnormality in the distal left  anterior descending artery as noted above.  EKG: Sinus, rate 66 bpm. 1st degree AV block. Poor R wave progression precordial leads. Non-specific T wave abnormality.   Assessment and Plan:   1. CAD: Stable. She has been on an ASA. She does not tolerate statins or beta blockers. Will arrange echo to assess LVEF.   2. Hyperlipidemia: She does not tolerate statins. Lipids followed in primary care.   Extensive chart review today, old cath reports, old EKGs, prior office notes.

## 2013-09-07 ENCOUNTER — Ambulatory Visit: Payer: 59 | Admitting: Podiatrist

## 2013-09-20 ENCOUNTER — Encounter: Payer: Self-pay | Admitting: Cardiovascular Disease

## 2013-09-20 ENCOUNTER — Ambulatory Visit (HOSPITAL_COMMUNITY): Payer: Medicare Other | Attending: Cardiovascular Disease | Admitting: Radiology

## 2013-09-20 DIAGNOSIS — I252 Old myocardial infarction: Secondary | ICD-10-CM | POA: Insufficient documentation

## 2013-09-20 DIAGNOSIS — R9431 Abnormal electrocardiogram [ECG] [EKG]: Secondary | ICD-10-CM

## 2013-09-20 DIAGNOSIS — I440 Atrioventricular block, first degree: Secondary | ICD-10-CM | POA: Insufficient documentation

## 2013-09-20 DIAGNOSIS — E785 Hyperlipidemia, unspecified: Secondary | ICD-10-CM | POA: Insufficient documentation

## 2013-09-20 DIAGNOSIS — Z87891 Personal history of nicotine dependence: Secondary | ICD-10-CM | POA: Insufficient documentation

## 2013-09-20 DIAGNOSIS — I2510 Atherosclerotic heart disease of native coronary artery without angina pectoris: Secondary | ICD-10-CM

## 2013-09-20 DIAGNOSIS — I359 Nonrheumatic aortic valve disorder, unspecified: Secondary | ICD-10-CM | POA: Insufficient documentation

## 2013-09-20 NOTE — Progress Notes (Signed)
Echocardiogram performed.  

## 2013-09-21 ENCOUNTER — Encounter: Payer: Self-pay | Admitting: Podiatrist

## 2013-09-21 ENCOUNTER — Ambulatory Visit (INDEPENDENT_AMBULATORY_CARE_PROVIDER_SITE_OTHER): Payer: 59 | Admitting: Podiatrist

## 2013-09-21 VITALS — BP 123/65 | HR 67 | Resp 18

## 2013-09-21 DIAGNOSIS — B351 Tinea unguium: Secondary | ICD-10-CM

## 2013-09-21 MED ORDER — EFINACONAZOLE 10 % EX SOLN: 1.0000 [drp] | Freq: Every day | CUTANEOUS | Status: DC

## 2013-09-21 NOTE — Patient Instructions (Signed)
Your prescription has been sent to [facility] as it is the most affordable place to fill your prescription for this particular drug.  They will contact you to be sure you want them to fill the medication for you. If you haven't heard from them in 24 hours, give them a call.  Their phone number is [phone].  Their pharmacy hours are Mon-Thurs 8-7; Fri 8-6pm.

## 2013-09-21 NOTE — Progress Notes (Signed)
Patient states "Well I think I see some improvement and the pharmacy suggested I use vapor rub on my toenails every night along with the jublia"  Patient presents today for re\re lasering of all 10 toenails. She states she used at the sample of Jublia and has been using Vicks VapoRub on her toes as well. She's noticed some improvement especially on the toes 2, 3, 4 on the right foot.  Laserering of Toenails bilateral carried out at today's visit via Q-switch YAG laser by QClear Laser at continuous Patient and staff were wearing appropriate laser protective goggles/eyewear Laser device was tested prior to use and safety protocols were followed Frequency of 5 Hz, Level 4, Joules 7.5 delivered.     The patient tolerated the lasering well and will call if any concerns arise. Will re laser in 2 months. A rx for Jublia was called in to [facility]

## 2013-10-02 ENCOUNTER — Encounter: Payer: Self-pay | Admitting: Internal Medicine

## 2013-11-19 ENCOUNTER — Ambulatory Visit (AMBULATORY_SURGERY_CENTER): Payer: Self-pay

## 2013-11-19 VITALS — Ht 66.0 in | Wt 118.0 lb

## 2013-11-19 DIAGNOSIS — Z1211 Encounter for screening for malignant neoplasm of colon: Secondary | ICD-10-CM

## 2013-11-19 MED ORDER — MOVIPREP 100 G PO SOLR: 1.0000 | Freq: Once | ORAL | Status: DC

## 2013-11-23 ENCOUNTER — Ambulatory Visit (INDEPENDENT_AMBULATORY_CARE_PROVIDER_SITE_OTHER): Payer: 59 | Admitting: Podiatrist

## 2013-11-23 ENCOUNTER — Encounter: Payer: Self-pay | Admitting: Podiatrist

## 2013-11-23 VITALS — BP 120/67 | HR 61 | Resp 12

## 2013-11-23 DIAGNOSIS — B351 Tinea unguium: Secondary | ICD-10-CM

## 2013-11-23 NOTE — Progress Notes (Signed)
Patient presents today for reevaluation all 10 toenails of bilateral feet. She states she hasn't noticed much different than we've done several laser treatments on her feet. She used Jublia for one month and noticed no relief.  Objective: Onychomycosis appearance all 10 toenails is noted despite aggressive laser therapy and topical treatment. She does not want to take oral therapy.  Assessment is nonresponding onychomycosis 2 laser therapy   plan: Wrote a prescription for Jublia for her to continue this for the next 6 months. I will see her back in 6 months and we will decide how it is working. If there is no improvement at that time her only other alternative if she wants to continue with treatment would be oral therapy.

## 2013-11-27 ENCOUNTER — Encounter: Payer: Self-pay | Admitting: Internal Medicine

## 2013-12-03 ENCOUNTER — Ambulatory Visit (AMBULATORY_SURGERY_CENTER): Payer: Medicare Other | Admitting: Internal Medicine

## 2013-12-03 ENCOUNTER — Encounter: Payer: Self-pay | Admitting: Internal Medicine

## 2013-12-03 VITALS — BP 143/78 | HR 55 | Temp 96.1°F | Resp 24 | Ht 66.0 in | Wt 118.0 lb

## 2013-12-03 DIAGNOSIS — Z1211 Encounter for screening for malignant neoplasm of colon: Secondary | ICD-10-CM

## 2013-12-03 DIAGNOSIS — D126 Benign neoplasm of colon, unspecified: Secondary | ICD-10-CM

## 2013-12-03 MED ORDER — SODIUM CHLORIDE 0.9 % IV SOLN: 500.0000 mL | INTRAVENOUS | Status: DC

## 2013-12-03 NOTE — Progress Notes (Signed)
Called to room to assist during endoscopic procedure.  Patient ID and intended procedure confirmed with present staff. Received instructions for my participation in the procedure from the performing physician.  

## 2013-12-03 NOTE — Patient Instructions (Signed)
YOU HAD AN ENDOSCOPIC PROCEDURE TODAY AT [facility]: Refer to the procedure report that was given to you for any specific questions about what was found during the examination.  If the procedure report does not answer your questions, please call your gastroenterologist to clarify.  If you requested that your care partner not be given the details of your procedure findings, then the procedure report has been included in a sealed envelope for you to review at your convenience later.  YOU SHOULD EXPECT: Some feelings of bloating in the abdomen. Passage of more gas than usual.  Walking can help get rid of the air that was put into your GI tract during the procedure and reduce the bloating. If you had a lower endoscopy (such as a colonoscopy or flexible sigmoidoscopy) you may notice spotting of blood in your stool or on the toilet paper. If you underwent a bowel prep for your procedure, then you may not have a normal bowel movement for a few days.  DIET: Your first meal following the procedure should be a light meal and then it is ok to progress to your normal diet.  A half-sandwich or bowl of soup is an example of a good first meal.  Heavy or fried foods are harder to digest and may make you feel nauseous or bloated.  Likewise meals heavy in dairy and vegetables can cause extra gas to form and this can also increase the bloating.  Drink plenty of fluids but you should avoid alcoholic beverages for 24 hours. Try to eat a high fiber diet to help to prevent Diverticulitis.  ACTIVITY: Your care partner should take you home directly after the procedure.  You should plan to take it easy, moving slowly for the rest of the day.  You can resume normal activity the day after the procedure however you should NOT DRIVE or use heavy machinery for 24 hours (because of the sedation medicines used during the test).    SYMPTOMS TO REPORT IMMEDIATELY: A gastroenterologist can be reached at any hour.  During  normal business hours, 8:30 AM to 5:00 PM Monday through Friday, call [phone].  After hours and on weekends, please call the GI answering service at [phone] who will take a message and have the physician on call contact you.   Following lower endoscopy (colonoscopy or flexible sigmoidoscopy):  Excessive amounts of blood in the stool  Significant tenderness or worsening of abdominal pains  Swelling of the abdomen that is new, acute  Fever of 100F or higher  FOLLOW UP: If any biopsies were taken you will be contacted by phone or by letter within the next 1-3 weeks.  Call your gastroenterologist if you have not heard about the biopsies in 3 weeks.  Our staff will call the home number listed on your records the next business day following your procedure to check on you and address any questions or concerns that you may have at that time regarding the information given to you following your procedure. This is a courtesy call and so if there is no answer at the home number and we have not heard from you through the emergency physician on call, we will assume that you have returned to your regular daily activities without incident.  SIGNATURES/CONFIDENTIALITY: You and/or your care partner have signed paperwork which will be entered into your electronic medical record.  These signatures attest to the fact that that the information above on your After Visit Summary has been  reviewed and is understood.  Full responsibility of the confidentiality of this discharge information lies with you and/or your care-partner. 

## 2013-12-03 NOTE — Op Note (Signed)
[facility]  [address]. [place], [address]   COLONOSCOPY PROCEDURE REPORT  PATIENT: Savannah Freeman, Savannah Freeman  MR#: [id number] BIRTHDATE: Jan 16, 1941 , 72  yrs. old GENDER: Female ENDOSCOPIST: Eustace Quail, MD REFERRED VO:[id number] Avva, M.D. PROCEDURE DATE:  12/03/2013 PROCEDURE:   Colonoscopy with snare polypectomy x2 First Screening Colonoscopy - Avg.  risk and is 50 yrs.  old or older - No.  Prior Negative Screening - Now for repeat screening. 10 or more years since last screening  History of Adenoma - Now for follow-up colonoscopy & has been > or = to 3 yrs.  N/A  Polyps Removed Today? Yes. ASA CLASS:   Class II INDICATIONS:average risk screening. Negative index screening exam 2005 (Dr. Collene Mares). MEDICATIONS: MAC sedation, administered by CRNA and propofol (Diprivan) 200mg  IV  DESCRIPTION OF PROCEDURE:   After the risks benefits and alternatives of the procedure were thoroughly explained, informed consent was obtained.  A digital rectal exam revealed no abnormalities of the rectum.   The LB [id number]  endoscope was introduced through the anus and advanced to the cecum, which was identified by both the appendix and ileocecal valve. No adverse events experienced.   The quality of the prep was excellent, using MoviPrep  The instrument was then slowly withdrawn as the colon was fully examined.  COLON FINDINGS: Two polyps were found in the ascending colon (8 mm sessile) and rectum (8 mm lobulated).  A polypectomy was performed with a cold and hot snare respectively.  The resection was complete and the polyp tissue was completely retrieved.   Mild diverticulosis was noted in the sigmoid colon.   The colon mucosa was otherwise normal.  Retroflexed views revealed no abnormalities. The time to cecum=4 minutes 05 sec.  Withdrawal time=14 minutes 18 sec.  The scope was withdrawn and the procedure completed. COMPLICATIONS: There were no  complications.  ENDOSCOPIC IMPRESSION: 1.   Two polyps were found in the ascending colon and rectum; polypectomy was performed 2.   Mild diverticulosis was noted in the sigmoid colon 3.   The colon mucosa was otherwise normal  RECOMMENDATIONS: 1.Follow up colonoscopy in 5 years   eSigned:  Eustace Quail, MD 12/03/2013 9:25 AM   cc: Prince Solian, MD and The Patient

## 2013-12-03 NOTE — Progress Notes (Signed)
Procedure ends, to recovery, report given and VSS. 

## 2013-12-04 ENCOUNTER — Telehealth: Payer: Self-pay

## 2013-12-04 NOTE — Telephone Encounter (Signed)
  Follow up Call-  Call back number 12/03/2013  Post procedure Call Back phone  # [phone] hm  Permission to leave phone message Yes     Patient questions:  Do you have a fever, pain , or abdominal swelling? no Pain Score  0 *  Have you tolerated food without any problems? yes  Have you been able to return to your normal activities? yes  Do you have any questions about your discharge instructions: Diet   no Medications  no Follow up visit  no  Do you have questions or concerns about your Care? no  Actions: * If pain score is 4 or above: No action needed, pain <4.  Per The pt's husband, "She is asleep.  I think she caught a cold while she was there because the temperature was so cold."  I explained that she did not catch a cold from the temperature but a bug.  I appolagized for her being cold.  I asked what her sx were and he said she had nasal congestion only.  I asked if she had a fever and he said no.  I asked him to please let her know we called to check on her and to call back if her sx worsen.  He said he would. maw

## 2013-12-06 ENCOUNTER — Telehealth: Payer: Self-pay | Admitting: *Deleted

## 2013-12-06 MED ORDER — EFINACONAZOLE 10 % EX SOLN: 1.0000 "application " | Freq: Every day | CUTANEOUS | Status: DC

## 2013-12-06 NOTE — Telephone Encounter (Signed)
I tried to call [place] program for Jublia.  They said they don't record that your office had called them.  Please call to get this cleared up.  I e-scribed Jublia prescription to [facility].  I informed the patient via voicemail.  Told her to expect a call from them for insurance information.

## 2013-12-07 ENCOUNTER — Encounter: Payer: Self-pay | Admitting: Internal Medicine

## 2014-01-17 ENCOUNTER — Encounter: Payer: Self-pay | Admitting: Certified Nurse Midwife

## 2014-01-17 ENCOUNTER — Ambulatory Visit (INDEPENDENT_AMBULATORY_CARE_PROVIDER_SITE_OTHER): Payer: 59 | Admitting: Certified Nurse Midwife

## 2014-01-17 VITALS — BP 126/60 | HR 80 | Resp 18 | Ht 66.0 in | Wt 116.0 lb

## 2014-01-17 DIAGNOSIS — Z0000 Encounter for general adult medical examination without abnormal findings: Secondary | ICD-10-CM

## 2014-01-17 DIAGNOSIS — Z01419 Encounter for gynecological examination (general) (routine) without abnormal findings: Secondary | ICD-10-CM

## 2014-01-17 LAB — POCT URINALYSIS DIPSTICK
Bilirubin, UA: NEGATIVE
Glucose, UA: NEGATIVE
Ketones, UA: NEGATIVE
Leukocytes, UA: NEGATIVE
Nitrite, UA: NEGATIVE
PROTEIN UA: NEGATIVE
UROBILINOGEN UA: NEGATIVE
pH, UA: 8

## 2014-01-17 NOTE — Patient Instructions (Signed)

## 2014-01-17 NOTE — Progress Notes (Signed)
73 y.o. G34P0011 Married Caucasian Fe here for annual exam. Menopausal no HRT, denies vaginal bleeding or vaginal dryness. Contiues to see PCP every 6 months, for medication management of Thyroid. Recent diagnosis off shingles, currently on medication. Denies any vaginal or urinary symptoms. Continues to work on exercise and diet for Osteoporosis. Has trip planned to [place].!! No other health issues today.   Patient's last menstrual period was 08/30/1989.          Sexually active: no  The current method of family planning is none.    Exercising: yes  walking, lifting weights Smoker:  no  Health Maintenance: Pap:  2011  MMG:  12/2012 BI-RADS 1: Neg Colonoscopy:  11/2013 - every 5 years BMD:   12/2010 TDaP:  12/2012 Labs: PCP  UA: RBC=  Slight trace   reports that she has quit smoking. She has never used smokeless tobacco. She reports that she drinks about 2.4 ounces of alcohol per week. She reports that she does not use illicit drugs.  Past Medical History  Diagnosis Date  . CAD (coronary artery disease)   . Hypercholesteremia   . Osteoporosis   . Thyroid disease     hypothyroidism  . Depression   . Myocardial infarction   . Allergy   . Shingles     Past Surgical History  Procedure Laterality Date  . Dilation and curettage of uterus    . Cardiac catheterization    . Fractured jaw    . Wisdom tooth extraction      Current Outpatient Prescriptions  Medication Sig Dispense Refill  . aspirin 81 MG tablet Take 81 mg by mouth daily.        . Calcium-Vitamin D-Vitamin K (CALCIUM FOR WOMEN) 500-100-40 MG-UNT-MCG CHEW Chew 2 tablets by mouth 2 (two) times daily.       . Efinaconazole (JUBLIA) 10 % SOLN Apply 1 application topically daily.  4 mL  5  . escitalopram (LEXAPRO) 10 MG tablet Take 10 mg by mouth daily.      . famciclovir (FAMVIR) 500 MG tablet Take 500 mg by mouth 2 (two) times daily.       . Fish Oil-Canola Oil-Vit D3 LIQD Take by mouth daily. 1 tsp daily      .  levothyroxine (SYNTHROID, LEVOTHROID) 50 MCG tablet Take 50 mcg by mouth daily.      . Multiple Vitamin (MULTIVITAMIN) tablet Take by mouth daily.       Marland Kitchen OVER THE COUNTER MEDICATION Guercetin and bromelain 2 capsules bid for allergies      . rosuvastatin (CRESTOR) 5 MG tablet Take 5 mg by mouth daily.      . nitroGLYCERIN (NITROSTAT) 0.4 MG SL tablet Place 1 tablet (0.4 mg total) under the tongue every 5 (five) minutes as needed.  25 tablet  6   No current facility-administered medications for this visit.    Family History  Problem Relation Age of Onset  . Hypertension Mother   . Stroke Mother   . Heart disease Mother   . Hypertension Maternal Aunt   . Stroke Maternal Aunt   . Kidney disease Father   . Alzheimer's disease Father   . Colon cancer Neg Hx   . Pancreatic cancer Neg Hx   . Stomach cancer Neg Hx   . Esophageal cancer Neg Hx   . Rectal cancer Neg Hx     ROS:  Pertinent items are noted in HPI.  Otherwise, a comprehensive ROS was negative.  Exam:  BP 126/60  Pulse 80  Resp 18  Ht 5\' 6"  (1.676 m)  Wt 116 lb (52.617 kg)  BMI 18.73 kg/m2  LMP 08/30/1989 Height: 5\' 6"  (167.6 cm)  Ht Readings from Last 3 Encounters:  01/17/14 5\' 6"  (1.676 m)  12/03/13 5\' 6"  (1.676 m)  11/19/13 5\' 6"  (1.676 m)    General appearance: alert, cooperative and appears stated age Head: Normocephalic, without obvious abnormality, atraumatic Neck: no adenopathy, supple, symmetrical, trachea midline and thyroid normal to inspection and palpation and non-palpable Lungs: clear to auscultation bilaterally Breasts: normal appearance, no masses or tenderness, No nipple retraction or dimpling, No nipple discharge or bleeding, No axillary or supraclavicular adenopathy Heart: regular rate and rhythm Abdomen: soft, non-tender; no masses,  no organomegaly Extremities: extremities normal, atraumatic, no cyanosis or edema Skin: Skin color, texture, turgor normal. No rashes or lesions Lymph nodes:  Cervical, supraclavicular, and axillary nodes normal. No abnormal inguinal nodes palpated Neurologic: Grossly normal   Pelvic: External genitalia:  no lesions              Urethra:  normal appearing urethra with no masses, tenderness or lesions              Bartholin's and Skene's: normal                 Vagina:atrophic appearing vagina with normal color and discharge, no lesions              Cervix: normal, non tender              Pap taken: no Bimanual Exam:  Uterus:  normal size, contour, position, consistency, mobility, non-tender and anteflexed              Adnexa: normal adnexa and no mass, fullness, tenderness               Rectovaginal: Confirms               Anus:  normal sphincter tone, no lesions  A:  Well Woman with normal exam  Menopausal no HRT  Hypothyroid stable medication with PCP management  Osteoporosis no medication personal choice, works with OTC Vitamin D, calcium and exercise. Declines any other BMD screening.  Recent diagnosis of Shingles of scalp and neck on medication   P:   Reviewed health and wellness pertinent to exam  Aware of need to advise if vaginal bleeding  Continue follow up with PCP as indicated  Pap smear not taken today  Mammogram yearly  counseled on breast self exam, osteoporosis, adequate intake of calcium and vitamin D, Kegel's exercises  return annually or prn  An After Visit Summary was printed and given to the patient.

## 2014-01-22 NOTE — Progress Notes (Signed)
Reviewed personally.  M. Suzanne Kennice Finnie, MD.  

## 2014-04-19 ENCOUNTER — Telehealth: Payer: Self-pay | Admitting: *Deleted

## 2014-04-19 NOTE — Telephone Encounter (Signed)
I called and left her a message that Dr. Valentina Lucks does want to follow-up with you on the 28th, please keep your appointment.  Call if you have any questions on Monday.

## 2014-04-19 NOTE — Telephone Encounter (Signed)
I have been having laser treatments with Dr. Valentina Lucks.  We decided at my last appointment that the laser is not working for me and decided not to have further treatment.  I have an appointment scheduled for 04/26/2014, is this appointment good or not?  I won't be here until late this afternoon, just leave me a message.

## 2014-04-26 ENCOUNTER — Encounter: Payer: Self-pay | Admitting: Podiatrist

## 2014-04-26 ENCOUNTER — Ambulatory Visit (INDEPENDENT_AMBULATORY_CARE_PROVIDER_SITE_OTHER): Payer: 59 | Admitting: Podiatrist

## 2014-04-26 VITALS — BP 134/65 | HR 73 | Resp 18

## 2014-04-26 DIAGNOSIS — B351 Tinea unguium: Secondary | ICD-10-CM

## 2014-04-26 MED ORDER — EFINACONAZOLE 10 % EX SOLN: 1.0000 "application " | Freq: Every day | CUTANEOUS | Status: DC

## 2014-04-26 NOTE — Progress Notes (Signed)
  Chief Complaint  Patient presents with  . Nail Problem    I CAN TELL A DIFFERENCE ON MY LEFT FOOT AND THE RIGHT NOT SO MUCH     HPI: Patient is 73 y.o. female who presents today for follow up of mycotic toenails of bilateral feet.  We have done multiple laser treatments which did not clear the nail completely.  She used some of the Jublia sample but stopped when she didn't see any improvement.    Physical Exam  Patient is awake, alert, and oriented x 3.  In no acute distress.  Vascular status is intact with palpable pedal pulses at 2/4 DP and PT bilateral and capillary refill time within normal limits. Neurological sensation is also intact bilaterally via Semmes Weinstein monofilament at 5/5 sites. Light touch, vibratory sensation, Achilles tendon reflex is intact. Dermatological exam reveals skin color, turger and texture as normal. No open lesions present.  Musculature intact with dorsiflexion, plantarflexion, inversion, eversion.  Toenails are thickened at the distal portion with yellowish brown discoloration.  They are improved from the start of treatment but not fully cleared.    Assessment: mycotic toenails resistent to laser therapy  Plan: we did multple laser treatments which did not clear the nails so it was advised there is no need to continue.  We had a length discussion about oral therapy however in the past she has wanted to avoid this.  We spoke about a possible pulse dosing lamisil treatment to decrease possibility of any side effects.  For now, i recommended she use the jublia on a daily basis for at least 6 months before deciding if it has worked or not .  If she would like to consider oral treatment she will call.

## 2014-05-13 ENCOUNTER — Other Ambulatory Visit: Payer: Self-pay

## 2014-05-13 DIAGNOSIS — Z1231 Encounter for screening mammogram for malignant neoplasm of breast: Secondary | ICD-10-CM

## 2014-05-24 ENCOUNTER — Ambulatory Visit
Admission: RE | Admit: 2014-05-24 | Discharge: 2014-05-24 | Disposition: A | Discharge status: Home / Self Care | Payer: 59 | Source: Ambulatory Visit

## 2014-05-24 DIAGNOSIS — Z1231 Encounter for screening mammogram for malignant neoplasm of breast: Secondary | ICD-10-CM

## 2014-07-01 ENCOUNTER — Encounter: Payer: Self-pay | Admitting: Podiatrist

## 2014-09-09 ENCOUNTER — Ambulatory Visit: Payer: 59 | Admitting: Cardiovascular Disease

## 2014-09-20 ENCOUNTER — Ambulatory Visit: Payer: 59 | Admitting: Cardiovascular Disease

## 2014-09-20 ENCOUNTER — Ambulatory Visit: Payer: Self-pay | Admitting: Cardiovascular Disease

## 2014-11-01 ENCOUNTER — Other Ambulatory Visit: Payer: Self-pay | Admitting: *Deleted

## 2014-11-01 MED ORDER — EFINACONAZOLE 10 % EX SOLN: 1.0000 "application " | Freq: Every day | CUTANEOUS | Status: DC

## 2014-11-01 NOTE — Telephone Encounter (Signed)
[employer] [facility] sent a refill request for [employer].  Dr. Cala Bradford the refill plus 2 additional refills.

## 2014-11-04 NOTE — Progress Notes (Signed)
History of Present Illness: 74 yo patient with history of CAD, HLD who is here today for cardiac follow up. She has been followed in the past by Dr. Lia Foyer. She presented with a NSTEMI in September 2008 and Dr. Lia Foyer performed cath which showed 50-60% mid LAD stenosis, Circumflex and RCA had no significant disease. She has been managed medically since then. LVEF was noted by cath to be 40% but f/u echo October 2008 with LVEF=60%, no valve disease. She has not tolerated statins due to muscle pains but has tolerate low dose Crestor lately. She has not tolerated beta blockers due to fatigue.    She is here today for follow up.  She has been feeling well. No chest pain or SOB. No palpitations, dizziness, near syncope or syncope. She is very active.   Primary Care Physician: [facility]  Last Lipid Profile: Followed in primary care  Past Medical History  Diagnosis Date  . CAD (coronary artery disease)   . Hypercholesteremia   . Osteoporosis   . Thyroid disease     hypothyroidism  . Depression   . Myocardial infarction   . Allergy   . Shingles     Past Surgical History  Procedure Laterality Date  . Dilation and curettage of uterus    . Cardiac catheterization    . Fractured jaw    . Wisdom tooth extraction      Current Outpatient Prescriptions  Medication Sig Dispense Refill  . aspirin 81 MG tablet Take 81 mg by mouth daily.      . Calcium-Vitamin D-Vitamin K (CALCIUM FOR WOMEN) 500-100-40 MG-UNT-MCG CHEW Chew 2 tablets by mouth 2 (two) times daily.     . CRESTOR 10 MG tablet Take 10 mg by mouth daily. PT IS TAKING 5 MG PER DR'S REQUEST AT THIS TIME    . Efinaconazole (JUBLIA) 10 % SOLN Apply 1 application topically daily. 4 mL 2  . escitalopram (LEXAPRO) 10 MG tablet Take 10 mg by mouth daily.    . famciclovir (FAMVIR) 500 MG tablet Take 500 mg by mouth 2 (two) times daily.     . Fish Oil-Canola Oil-Vit D3 LIQD Take by mouth daily. 1 tsp daily    . gabapentin (NEURONTIN) 300 MG  capsule     . levothyroxine (SYNTHROID, LEVOTHROID) 50 MCG tablet Take 50 mcg by mouth daily.    . Multiple Vitamin (MULTIVITAMIN) tablet Take by mouth daily.     Marland Kitchen OVER THE COUNTER MEDICATION Guercetin and bromelain 2 capsules bid for allergies    . rosuvastatin (CRESTOR) 5 MG tablet Take 5 mg by mouth daily.    . nitroGLYCERIN (NITROSTAT) 0.4 MG SL tablet Place 1 tablet (0.4 mg total) under the tongue every 5 (five) minutes as needed. (Patient not taking: Reported on 11/05/2014) 25 tablet 6  . traMADol (ULTRAM) 50 MG tablet      No current facility-administered medications for this visit.    Allergies  Allergen Reactions  . Darvon [Propoxyphene Hcl]     Bil hearing decreases, occular migraines  . Prednisone     bil hearing decreased and occular migraines  . Septra [Sulfamethoxazole-Trimethoprim]     unknown    History   Social History  . Marital Status: Married    Spouse Name: N/A  . Number of Children: 1  . Years of Education: N/A   Occupational History  . Part time music teacher    Social History Main Topics  . Smoking status: Former Research scientist (life sciences)  .  Smokeless tobacco: Never Used  . Alcohol Use: 2.4 oz/week    4 Glasses of wine per week     Comment: every other day  . Drug Use: No  . Sexual Activity: No   Other Topics Concern  . Not on file   Social History Narrative    Family History  Problem Relation Age of Onset  . Hypertension Mother   . Stroke Mother   . Heart disease Mother   . Hypertension Maternal Aunt   . Stroke Maternal Aunt   . Kidney disease Father   . Alzheimer's disease Father   . Colon cancer Neg Hx   . Pancreatic cancer Neg Hx   . Stomach cancer Neg Hx   . Esophageal cancer Neg Hx   . Rectal cancer Neg Hx     Review of Systems:  As stated in the HPI and otherwise negative.   BP 156/76 mmHg  Pulse 66  Ht 5\' 6"  (1.676 m)  Wt 117 lb (53.071 kg)  BMI 18.89 kg/m2  LMP 08/30/1989  Physical Examination: General: Well developed, well  nourished, NAD HEENT: OP clear, mucus membranes moist SKIN: warm, dry. No rashes. Neuro: No focal deficits Musculoskeletal: Muscle strength 5/5 all ext Psychiatric: Mood and affect normal Neck: No JVD, no carotid bruits, no thyromegaly, no lymphadenopathy. Lungs:Clear bilaterally, no wheezes, rhonci, crackles Cardiovascular: Regular rate and rhythm. No murmurs, gallops or rubs. Abdomen:Soft. Bowel sounds present. Non-tender.  Extremities: No lower extremity edema. Pulses are 2 + in the bilateral DP/PT.  Cardiac cath 05/02/07:  4. The left main is free of critical disease.  5. The left anterior descending artery demonstrates about a 10-20%  area of narrowing at the proximal aspect. In the distal vessel  well beyond the diagonal there is an area of 50-60% narrowing and  this occurs at a location of a 180-degree bend in the artery. This  could represent potentially a ruptured plaque but we tried to lay  it out in multiple views and in the best views it did not appear to  be disrupted. The distal LAD wrapped the apex and provided the  distal inferior wall.  6. The circumflex was a relatively small system that appeared to be  free of significant disease.  7. The right coronary artery is relatively small system that also  appeared to be free of significant disease.  CONCLUSIONS:  1. Moderate reduction in left ventricular function with an  anteroapical distal inferior wall motion abnormality.  2. Overall smooth coronary vessels with abnormality in the distal left  anterior descending artery as noted above.  Echo 09/20/13: Left ventricle: The cavity size was normal. Systolic function was normal. Wall motion was normal; there were no regional wall motion abnormalities. No valve disease  EKG: Sinus, rate 66 bpm. 1st degree AV block. Poor R wave progression precordial leads. Incomplete RBBB.    Assessment and Plan:   1. CAD: Stable. She has been on an ASA. She does not tolerate beta  blockers. Echo 09/20/13 with normal LV size and function.  No recent ischemic testing. Will arrange exercise stress myoview given history of CAD with 50-60% mid LAD stenosis and no ischemic testing since then. BP has been well controlled at home.   2. Hyperlipidemia: Tolerating Crestor. Lipids followed in primary care.

## 2014-11-05 ENCOUNTER — Encounter: Payer: Self-pay | Admitting: Cardiovascular Disease

## 2014-11-05 ENCOUNTER — Ambulatory Visit (INDEPENDENT_AMBULATORY_CARE_PROVIDER_SITE_OTHER): Payer: Medicare Other | Admitting: Cardiovascular Disease

## 2014-11-05 VITALS — BP 156/76 | HR 66 | Ht 66.0 in | Wt 117.0 lb

## 2014-11-05 DIAGNOSIS — I2510 Atherosclerotic heart disease of native coronary artery without angina pectoris: Secondary | ICD-10-CM

## 2014-11-05 DIAGNOSIS — E7800 Pure hypercholesterolemia, unspecified: Secondary | ICD-10-CM

## 2014-11-05 NOTE — Patient Instructions (Signed)
Your physician wants you to follow-up in:  12 months.  You will receive a reminder letter in the mail two months in advance. If you don't receive a letter, please call our office to schedule the follow-up appointment.  Your physician has requested that you have an exercise stress myoview. For further information please visit [email or web address]. Please follow instruction sheet, as given.  

## 2014-11-06 ENCOUNTER — Ambulatory Visit (HOSPITAL_COMMUNITY): Payer: Medicare Other | Attending: Cardiovascular Disease | Admitting: Radiology

## 2014-11-06 DIAGNOSIS — I2510 Atherosclerotic heart disease of native coronary artery without angina pectoris: Secondary | ICD-10-CM | POA: Diagnosis present

## 2014-11-06 MED ORDER — TECHNETIUM TC 99M SESTAMIBI GENERIC - CARDIOLITE
11.0000 | Freq: Once | INTRAVENOUS | Status: AC | PRN
  Administered 2014-11-06: 11 via INTRAVENOUS

## 2014-11-06 MED ORDER — TECHNETIUM TC 99M SESTAMIBI GENERIC - CARDIOLITE
33.0000 | Freq: Once | INTRAVENOUS | Status: AC | PRN
  Administered 2014-11-06: 33 via INTRAVENOUS

## 2014-11-06 NOTE — Progress Notes (Signed)
[facility] [address] [place], [place] [address] [phone]    Cardiology Nuclear Med Study  Savannah Freeman is a 74 y.o. female     MRN : [id number]     DOB: 1941/08/20  Procedure Date: 11/06/2014  Nuclear Med Background Indication for Stress Test:  Evaluation for Ischemia, Abnormal EKG and Follow up CAD History:  CAD, RBBB Cardiac Risk Factors: Family History - CAD  Symptoms:  None indicated   Nuclear Pre-Procedure Caffeine/Decaff Intake:  None NPO After: 9:30pm   Lungs:  clear O2 Sat: 98% on room air. IV 0.9% NS with Angio Cath:  22g  IV Site: R Hand  IV Started by:  Crissie Figures, RN  Chest Size (in):  36 Cup Size: A  Height: 5\' 6"  (1.676 m)  Weight:  117 lb (53.071 kg)  BMI:  Body mass index is 18.89 kg/(m^2). Tech Comments:  N/A    Nuclear Med Study 1 or 2 day study: 1 day  Stress Test Type:  Stress  Reading MD: N/A  Order Authorizing Provider:  Lauree Chandler, MD  Resting Radionuclide: Technetium 65m Sestamibi  Resting Radionuclide Dose: 11.0 mCi   Stress Radionuclide:  Technetium 16m Sestamibi  Stress Radionuclide Dose: 33.0 mCi           Stress Protocol Rest HR: 65 Stress HR: 137  Rest BP: 183/90 Stress BP: 191/55  Exercise Time (min): 7:00 METS: 8.5   Predicted Max HR: 148 bpm % Max HR: 92.57 bpm Rate Pressure Product: 26167   Dose of Adenosine (mg):  n/a Dose of Lexiscan: n/a mg  Dose of Atropine (mg): n/a Dose of Dobutamine: n/a mcg/kg/min (at max HR)  Stress Test Technologist: Glade Lloyd, BS-ES  Nuclear Technologist:  Earl Many, CNMT     Rest Procedure:  Myocardial perfusion imaging was performed at rest 45 minutes following the intravenous administration of Technetium 18m Sestamibi. Rest ECG: NSR with rSR' consistent with RV conduction delay  Stress Procedure:  The patient exercised on the treadmill utilizing the Bruce Protocol for 7:00 minutes. The patient stopped due to fatigue and denied any chest  pain.  Technetium 63m Sestamibi was injected at peak exercise and myocardial perfusion imaging was performed after a brief delay. Stress ECG: less than 50mm of upsloping ST segement depression in the inferior leads and less than 45mm of hortizontal ST segment depression in the lateral precordial leads.  QPS Raw Data Images:  Mild diaphragmatic attenuation.  Normal left ventricular size. Stress Images:  There is decreased uptake in the mid and basal inferoseptum. Rest Images:  There is decreased uptake in the mid and basal inferoseptum. Subtraction (SDS):  small in size, moderate in severity, partially fixed defect in the mid and basal inferoseptum Transient Ischemic Dilatation (Normal <1.22):  0.91 Lung/Heart Ratio (Normal <0.45):  0.27  Quantitative Gated Spect Images QGS EDV:  48 ml QGS ESV:  13 ml  Impression Exercise Capacity:  Good exercise capacity. BP Response:  Normal blood pressure response. Clinical Symptoms:  No significant symptoms noted. ECG Impression:  less than 32mm of upsloping ST segement depression in the inferior leads and less than 67mm of hortizontal ST segment depression in the lateral precordial leads. Comparison with Prior Nuclear Study: No images to compare  Overall Impression:  Intermediate risk stress nuclear study with a small in size, moderate in severity, partially fixed defect in the mid and basal inferoseptum.  Cannot rule out a small area of ischemia. Ekg changes  were noted with less than 40mm of upsloping ST segement depression in the inferior leads and less than 107mm of hortizontal ST segment depression in the lateral precordial leads.  LV Ejection Fraction: 74%.  LV Wall Motion:  NL LV Function; NL Wall Motion  Signed: Fransico Him, MD [facility] 11/06/2014

## 2014-11-07 ENCOUNTER — Encounter: Payer: Self-pay | Admitting: Cardiology

## 2014-11-08 ENCOUNTER — Telehealth: Payer: Self-pay | Admitting: Cardiovascular Disease

## 2014-11-08 NOTE — Telephone Encounter (Signed)
Spoke with pt and reviewed stress test results with her.  

## 2014-11-08 NOTE — Telephone Encounter (Signed)
New Msg        Pt calling, states she is returning call from today.   Please call.

## 2014-11-13 ENCOUNTER — Other Ambulatory Visit: Payer: Self-pay | Admitting: *Deleted

## 2014-11-13 NOTE — Telephone Encounter (Signed)
Refill request was sent for Jublia 10%.  Dr. Cala Bradford the refill plus 2 additional refills.

## 2014-11-18 HISTORY — PX: CATARACT EXTRACTION: SUR2

## 2015-01-21 ENCOUNTER — Ambulatory Visit (INDEPENDENT_AMBULATORY_CARE_PROVIDER_SITE_OTHER): Payer: Medicare Other | Admitting: Certified Nurse Midwife

## 2015-01-21 ENCOUNTER — Encounter: Payer: Self-pay | Admitting: Certified Nurse Midwife

## 2015-01-21 VITALS — BP 130/74 | HR 70 | Ht 65.5 in | Wt 119.6 lb

## 2015-01-21 DIAGNOSIS — Z01419 Encounter for gynecological examination (general) (routine) without abnormal findings: Secondary | ICD-10-CM | POA: Diagnosis not present

## 2015-01-21 NOTE — Patient Instructions (Signed)

## 2015-01-21 NOTE — Progress Notes (Signed)
74 y.o. G24P0011 Married  Caucasian Fe here for annual exam.Menopausal no HRT. Denies vaginal bleeding or vaginal dryness. She had cataract surgery on right eye in 3/16. Doing well since surgery. Still exercising and teaching music. Sees Dr. Dagmar Hait twice yearly for cholesterol/hypertension/and thyroid management/ labs and aex. All stable per patient. No health concerns today. Leaving for [place] in July!!  Patient's last menstrual period was 08/30/1989.          Sexually active: No.  The current method of family planning is post menopausal status.    Exercising: Yes.    walking, lift weights every day and 2-3x/wk Smoker:  no  Health Maintenance: Pap:  2011  MMG:  05/27/14 bi-rads 1: negative Colonoscopy:   12/03/13  Polyps otherwise normal f/u in 2020 BMD:   01/22/11  TDaP:  01/17/13  Labs done by Prince Solian, MD    reports that she has quit smoking. She has never used smokeless tobacco. She reports that she drinks about 2.4 oz of alcohol per week. She reports that she does not use illicit drugs.  Past Medical History  Diagnosis Date  . CAD (coronary artery disease)   . Hypercholesteremia   . Osteoporosis   . Thyroid disease     hypothyroidism  . Depression   . Myocardial infarction   . Allergy   . Shingles     Past Surgical History  Procedure Laterality Date  . Dilation and curettage of uterus    . Cardiac catheterization    . Fractured jaw    . Wisdom tooth extraction      Current Outpatient Prescriptions  Medication Sig Dispense Refill  . aspirin 81 MG tablet Take 81 mg by mouth daily.      . Calcium-Vitamin D-Vitamin K (CALCIUM FOR WOMEN) 500-100-40 MG-UNT-MCG CHEW Chew 2 tablets by mouth 2 (two) times daily.     . CRESTOR 10 MG tablet Take 10 mg by mouth daily. PT IS TAKING 5 MG PER DR'S REQUEST AT THIS TIME    . escitalopram (LEXAPRO) 10 MG tablet Take 10 mg by mouth daily.    . Fish Oil-Canola Oil-Vit D3 LIQD Take by mouth daily. 1 tsp daily    . levothyroxine  (SYNTHROID, LEVOTHROID) 50 MCG tablet Take 50 mcg by mouth daily.    . Multiple Vitamin (MULTIVITAMIN) tablet Take by mouth daily.     . nitroGLYCERIN (NITROSTAT) 0.4 MG SL tablet Place 1 tablet (0.4 mg total) under the tongue every 5 (five) minutes as needed. 25 tablet 6  . rosuvastatin (CRESTOR) 5 MG tablet Take 5 mg by mouth daily.     No current facility-administered medications for this visit.    Family History  Problem Relation Age of Onset  . Hypertension Mother   . Stroke Mother   . Heart disease Mother   . Hypertension Maternal Aunt   . Stroke Maternal Aunt   . Kidney disease Father   . Alzheimer's disease Father   . Colon cancer Neg Hx   . Pancreatic cancer Neg Hx   . Stomach cancer Neg Hx   . Esophageal cancer Neg Hx   . Rectal cancer Neg Hx     ROS:  Pertinent items are noted in HPI.  Otherwise, a comprehensive ROS was negative.  Exam:   BP 130/74 mmHg  Ht 5' 5.5" (1.664 m)  Wt 119 lb 9.6 oz (54.25 kg)  BMI 19.59 kg/m2  LMP 08/30/1989 Height: 5' 5.5" (166.4 cm) Ht Readings from Last 3 Encounters:  01/21/15 5' 5.5" (1.664 m)  11/06/14 5\' 6"  (1.676 m)  11/05/14 5\' 6"  (1.676 m)    General appearance: alert, cooperative and appears stated age Head: Normocephalic, without obvious abnormality, atraumatic Neck: no adenopathy, supple, symmetrical, trachea midline and thyroid normal to inspection and palpation Lungs: clear to auscultation bilaterally Breasts: normal appearance, no masses or tenderness, No nipple retraction or dimpling, No nipple discharge or bleeding, No axillary or supraclavicular adenopathy Heart: regular rate and rhythm Abdomen: soft, non-tender; no masses,  no organomegaly Extremities: extremities normal, atraumatic, no cyanosis or edema Skin: Skin color, texture, turgor normal. No rashes or lesions Lymph nodes: Cervical, supraclavicular, and axillary nodes normal. No abnormal inguinal nodes palpated Neurologic: Grossly normal   Pelvic:  External genitalia:  no lesions              Urethra:  normal appearing urethra with no masses, tenderness or lesions              Bartholin's and Skene's: normal                 Vagina: normal appearing vagina with normal color and discharge, no lesions              Cervix: normal,non tender, posterior              Pap taken: No. Bimanual Exam:  Uterus:  normal size, contour, position, consistency, mobility, non-tender              Adnexa: normal adnexa and no mass, fullness, tenderness               Rectovaginal: Confirms               Anus:  normal sphincter tone, no lesions  Chaperone present: Yes  A:  Well Woman with normal exam  Menopausal no HRT  BMD due  Hypertension/cholesterol/hypothyroid management with PCP  P:   Reviewed health and wellness pertinent to exam  Aware of need to evaluate if vaginal bleeding  Discussed BMD due, patient aware and plans to schedule after her trip. Vitamin D with PCP was normal per patient.  Continue MD follow up as indicated.  Pap smear not taken today   counseled on breast self exam, mammography screening, adequate intake of calcium and vitamin D, diet and exercise, Kegel's exercises  return annually or prn  An After Visit Summary was printed and given to the patient.

## 2015-01-22 NOTE — Progress Notes (Signed)
Reviewed personally.  M. Suzanne Natahlia Hoggard, MD.  

## 2015-04-21 ENCOUNTER — Telehealth: Payer: Self-pay | Admitting: Certified Nurse Midwife

## 2015-04-21 ENCOUNTER — Other Ambulatory Visit: Payer: Self-pay

## 2015-04-21 DIAGNOSIS — Z780 Asymptomatic menopausal state: Secondary | ICD-10-CM

## 2015-04-21 DIAGNOSIS — E2839 Other primary ovarian failure: Secondary | ICD-10-CM

## 2015-04-21 NOTE — Telephone Encounter (Signed)
Spoke with patient. Advised order for BMD has been placed electronically via EPIC to the [facility]. Patient is agreeable and will call to schedule at this time.   Routing to provider for final review. Patient agreeable to disposition. Will close encounter.   Patient aware provider will review message and nurse will return call if any additional advice or change of disposition.

## 2015-04-21 NOTE — Telephone Encounter (Signed)
Patient need an order for a bone density faxed to the [facility] center.

## 2015-05-09 ENCOUNTER — Other Ambulatory Visit: Payer: Self-pay | Admitting: Certified Nurse Midwife

## 2015-05-09 DIAGNOSIS — Z1231 Encounter for screening mammogram for malignant neoplasm of breast: Secondary | ICD-10-CM

## 2015-06-20 ENCOUNTER — Ambulatory Visit
Admission: RE | Admit: 2015-06-20 | Discharge: 2015-06-20 | Disposition: A | Discharge status: Home / Self Care | Payer: Medicare Other | Source: Ambulatory Visit | Attending: Certified Nurse Midwife | Admitting: Certified Nurse Midwife

## 2015-06-20 DIAGNOSIS — Z780 Asymptomatic menopausal state: Secondary | ICD-10-CM

## 2015-06-20 DIAGNOSIS — E2839 Other primary ovarian failure: Secondary | ICD-10-CM

## 2015-06-20 DIAGNOSIS — Z1231 Encounter for screening mammogram for malignant neoplasm of breast: Secondary | ICD-10-CM

## 2015-07-10 ENCOUNTER — Telehealth: Payer: Self-pay

## 2015-07-10 NOTE — Telephone Encounter (Signed)
-----   Message from Regina Eck, CNM sent at 07/10/2015  8:02 AM EST ----- Can you call her she has not completed her BMD, we had placed order for

## 2015-07-10 NOTE — Telephone Encounter (Signed)
[email or web address]

## 2015-07-15 NOTE — Telephone Encounter (Signed)
Patient returning your call and her phone is with her so she will be able to answer your call.

## 2015-07-15 NOTE — Telephone Encounter (Signed)
Left message for call back.

## 2015-07-15 NOTE — Telephone Encounter (Signed)
Left message to call back  

## 2015-07-15 NOTE — Telephone Encounter (Signed)
Pt states she went 107mth ago & they were in the process of getting a new machine with better capability so they had her reschedule until 07-29-15

## 2015-07-28 ENCOUNTER — Other Ambulatory Visit: Payer: Medicare Other

## 2015-07-29 ENCOUNTER — Ambulatory Visit
Admission: RE | Admit: 2015-07-29 | Discharge: 2015-07-29 | Disposition: A | Discharge status: Home / Self Care | Payer: Medicare Other | Source: Ambulatory Visit | Attending: Certified Nurse Midwife | Admitting: Certified Nurse Midwife

## 2015-08-07 ENCOUNTER — Encounter: Payer: Self-pay | Admitting: Obstetrics and Gynecology

## 2015-08-07 ENCOUNTER — Ambulatory Visit (INDEPENDENT_AMBULATORY_CARE_PROVIDER_SITE_OTHER): Payer: Medicare Other | Admitting: Obstetrics and Gynecology

## 2015-08-07 VITALS — BP 132/70 | HR 76 | Resp 15 | Wt 122.0 lb

## 2015-08-07 DIAGNOSIS — M810 Age-related osteoporosis without current pathological fracture: Secondary | ICD-10-CM
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
## 2015-08-07 NOTE — Patient Instructions (Signed)
Calcium 1,200 mg a day, vit D 1,000 IU a day (this may be adjusted based on your blood work) Osteoporosis Osteoporosis is the thinning and loss of density in the bones. Osteoporosis makes the bones more brittle, fragile, and likely to break (fracture). Over time, osteoporosis can cause the bones to become so weak that they fracture after a simple fall. The bones most likely to fracture are the bones in the hip, wrist, and spine. CAUSES  The exact cause is not known. RISK FACTORS Anyone can develop osteoporosis. You may be at greater risk if you have a family history of the condition or have poor nutrition. You may also have a higher risk if you are:   Female.   8 years old or older.  A smoker.  Not physically active.   White or Asian.  Slender. SIGNS AND SYMPTOMS  A fracture might be the first sign of the disease, especially if it results from a fall or injury that would not usually cause a bone to break. Other signs and symptoms include:   Low back and neck pain.  Stooped posture.  Height loss. DIAGNOSIS  To make a diagnosis, your health care provider may:  Take a medical history.  Perform a physical exam.  Order tests, such as:  A bone mineral density test.  A dual-energy X-ray absorptiometry test. TREATMENT  The goal of osteoporosis treatment is to strengthen your bones to reduce your risk of a fracture. Treatment may involve:  Making lifestyle changes, such as:  Eating a diet rich in calcium.  Doing weight-bearing and muscle-strengthening exercises.  Stopping tobacco use.  Limiting alcohol intake.  Taking medicine to slow the process of bone loss or to increase bone density.  Monitoring your levels of calcium and vitamin D. HOME CARE INSTRUCTIONS  Include calcium and vitamin D in your diet. Calcium is important for bone health, and vitamin D helps the body absorb calcium.  Perform weight-bearing and muscle-strengthening exercises as directed by your  health care provider.  Do not use any tobacco products, including cigarettes, chewing tobacco, and electronic cigarettes. If you need help quitting, ask your health care provider.  Limit your alcohol intake.  Take medicines only as directed by your health care provider.  Keep all follow-up visits as directed by your health care provider. This is important.  Take precautions at home to lower your risk of falling, such as:  Keeping rooms well lit and clutter free.  Installing safety rails on stairs.  Using rubber mats in the bathroom and other areas that are often wet or slippery. SEEK IMMEDIATE MEDICAL CARE IF:  You fall or injure yourself.    This information is not intended to replace advice given to you by your health care provider. Make sure you discuss any questions you have with your health care provider.   Document Released: 05/26/2005 Document Revised: 09/06/2014 Document Reviewed: 01/24/2014 Elsevier Interactive Patient Education [employer].

## 2015-08-31 DIAGNOSIS — H269 Unspecified cataract: Secondary | ICD-10-CM

## 2015-08-31 HISTORY — DX: Unspecified cataract: H26.9

## 2015-10-15 ENCOUNTER — Telehealth: Payer: Self-pay | Admitting: *Deleted

## 2015-10-15 NOTE — Telephone Encounter (Signed)
-----   Message from Salvadore Dom, MD sent at 10/15/2015 12:08 PM EST ----- Can you please check on this patient. She has osteoporosis, I ordered a bunch of labs on her that she never returned for. Even if she declines treatment the lab work can help determine if something could be contributing to her osteoporosis.   Thanks, Sharee Pimple ----- Message -----    From: SYSTEM    Sent: 08/12/2015  12:05 AM      To: Salvadore Dom, MD

## 2015-10-15 NOTE — Telephone Encounter (Signed)
I spoke with patient and she thought she had to collect her urine for a week. I explained that it was only 24 hour urine and she agreed to do it this week and bring it back in and have the other labs drawn.

## 2015-10-21 LAB — COMPREHENSIVE METABOLIC PANEL
ALBUMIN: 4.2 g/dL (ref 3.6–5.1)
ALK PHOS: 44 U/L (ref 33–130)
ALT: 15 U/L (ref 6–29)
AST: 18 U/L (ref 10–35)
BUN: 18 mg/dL (ref 7–25)
CO2: 25 mmol/L (ref 20–31)
CREATININE: 0.78 mg/dL (ref 0.60–0.93)
Calcium: 9.2 mg/dL (ref 8.6–10.4)
Chloride: 100 mmol/L (ref 98–110)
Glucose, Bld: 92 mg/dL (ref 65–99)
POTASSIUM: 4.3 mmol/L (ref 3.5–5.3)
Sodium: 137 mmol/L (ref 135–146)
TOTAL PROTEIN: 6.7 g/dL (ref 6.1–8.1)
Total Bilirubin: 0.5 mg/dL (ref 0.2–1.2)

## 2015-10-21 LAB — CBC
HEMATOCRIT: 41 % (ref 36.0–46.0)
HEMOGLOBIN: 14.1 g/dL (ref 12.0–15.0)
MCH: 30 pg (ref 26.0–34.0)
MCHC: 34.4 g/dL (ref 30.0–36.0)
MCV: 87.2 fL (ref 78.0–100.0)
MPV: 8.9 fL (ref 8.6–12.4)
PLATELETS: 287 10*3/uL (ref 150–400)
RBC: 4.7 MIL/uL (ref 3.87–5.11)
RDW: 12.6 % (ref 11.5–15.5)
WBC: 5.8 10*3/uL (ref 4.0–10.5)

## 2015-10-21 LAB — TSH: TSH: 1.72 m[IU]/L

## 2015-10-22 LAB — VITAMIN D 25 HYDROXY (VIT D DEFICIENCY, FRACTURES): Vit D, 25-Hydroxy: 35 ng/mL (ref 30–100)

## 2015-10-22 LAB — CALCIUM, URINE, 24 HOUR
CALCIUM UR: 8 mg/dL
Calcium, 24 hour urine: 100 mg/24 h (ref 35–250)

## 2015-10-22 LAB — CREATININE CLEARANCE, URINE, 24 HOUR
CREAT CLEAR: 70 mL/min — AB (ref 75–115)
CREATININE 24H UR: 0.79 g/(24.h) (ref 0.63–2.50)
Creatinine, Urine: 63 mg/dL (ref 20–320)
Creatinine: 0.78 mg/dL (ref 0.60–0.93)

## 2015-10-22 LAB — PARATHYROID HORMONE, INTACT (NO CA): PTH: 63 pg/mL (ref 14–64)

## 2015-10-29 ENCOUNTER — Other Ambulatory Visit: Payer: Self-pay | Admitting: Obstetrics and Gynecology

## 2015-10-29 MED ORDER — ALENDRONATE SODIUM 70 MG PO TABS: 70.0000 mg | ORAL_TABLET | ORAL | Status: DC

## 2015-10-31 ENCOUNTER — Encounter: Payer: Self-pay | Admitting: Cardiovascular Disease

## 2016-01-23 ENCOUNTER — Ambulatory Visit (INDEPENDENT_AMBULATORY_CARE_PROVIDER_SITE_OTHER): Payer: Medicare Other | Admitting: Cardiovascular Disease

## 2016-01-23 ENCOUNTER — Encounter: Payer: Self-pay | Admitting: Cardiovascular Disease

## 2016-01-23 VITALS — BP 110/64 | HR 63 | Ht 65.0 in | Wt 121.0 lb

## 2016-01-23 DIAGNOSIS — E7800 Pure hypercholesterolemia, unspecified: Secondary | ICD-10-CM | POA: Diagnosis not present

## 2016-01-23 DIAGNOSIS — I2510 Atherosclerotic heart disease of native coronary artery without angina pectoris: Secondary | ICD-10-CM | POA: Diagnosis not present

## 2016-01-23 NOTE — Progress Notes (Signed)
Chief Complaint  Patient presents with  . Follow-up     History of Present Illness: 75 yo female with history of CAD, HLD who is here today for cardiac follow up. She had been followed in the past by Dr. Lia Foyer. She presented with a NSTEMI in September 2008 and Dr. Lia Foyer performed cath which showed 50-60% mid LAD stenosis. The Circumflex and RCA had no significant disease. She has been managed medically since then. LVEF was noted by cath to be 40% but f/u echo October 2008 with LVEF=60%, no valve disease. She has not tolerated statins due to muscle pains but has tolerate low dose Crestor lately. She has not tolerated beta blockers due to fatigue.  Echo 09/20/13 with normal LV function. Nuclear stress test in March 2016 with no ischemia.   She is here today for follow up.  She has been feeling well. No chest pain or SOB. No palpitations, dizziness, near syncope or syncope. She is very active. She has some balance issues.   Primary Care Physician: Avva  Last Lipid Profile: Followed in primary care  Past Medical History  Diagnosis Date  . CAD (coronary artery disease)   . Hypercholesteremia   . Osteoporosis   . Thyroid disease     hypothyroidism  . Depression   . Myocardial infarction ([place])   . Allergy   . Shingles     Past Surgical History  Procedure Laterality Date  . Dilation and curettage of uterus    . Cardiac catheterization    . Fractured jaw    . Wisdom tooth extraction    . Cataract extraction Right 11/18/14    Current Outpatient Prescriptions  Medication Sig Dispense Refill  . alendronate (FOSAMAX) 70 MG tablet Take 1 tablet (70 mg total) by mouth every 7 (seven) days. Take with a full glass of water on an empty stomach. Stay upright for an additional 30 minutes and don't eat or drink anything for 30 minutes 12 tablet 3  . aspirin 81 MG tablet Take 81 mg by mouth daily.      . Calcium-Vitamin D-Vitamin K (CALCIUM FOR WOMEN) 500-100-40 MG-UNT-MCG CHEW Chew 2 tablets  by mouth 2 (two) times daily.     Marland Kitchen escitalopram (LEXAPRO) 10 MG tablet Take 10 mg by mouth daily.    . Fish Oil-Canola Oil-Vit D3 LIQD Take by mouth daily. 1 tsp daily    . levothyroxine (SYNTHROID, LEVOTHROID) 50 MCG tablet Take 50 mcg by mouth daily.    . Multiple Vitamin (MULTIVITAMIN) tablet Take by mouth daily.     . nitroGLYCERIN (NITROSTAT) 0.4 MG SL tablet Place 1 tablet (0.4 mg total) under the tongue every 5 (five) minutes as needed. 25 tablet 6  . rosuvastatin (CRESTOR) 5 MG tablet Take 5 mg by mouth daily.     No current facility-administered medications for this visit.    Allergies  Allergen Reactions  . Darvon [Propoxyphene Hcl]     Bil hearing decreases, occular migraines  . Prednisone     bil hearing decreased and occular migraines  . Septra [Sulfamethoxazole-Trimethoprim]     unknown    Social History   Social History  . Marital Status: Married    Spouse Name: N/A  . Number of Children: 1  . Years of Education: N/A   Occupational History  . Part time music teacher    Social History Main Topics  . Smoking status: Former Research scientist (life sciences)  . Smokeless tobacco: Never Used  . Alcohol Use: 2.4 -  3.0 oz/week    4-5 Glasses of wine per week     Comment: every other day  . Drug Use: No  . Sexual Activity: No   Other Topics Concern  . Not on file   Social History Narrative    Family History  Problem Relation Age of Onset  . Hypertension Mother   . Stroke Mother   . Heart disease Mother   . Hypertension Maternal Aunt   . Stroke Maternal Aunt   . Kidney disease Father   . Alzheimer's disease Father   . Colon cancer Neg Hx   . Pancreatic cancer Neg Hx   . Stomach cancer Neg Hx   . Esophageal cancer Neg Hx   . Rectal cancer Neg Hx     Review of Systems:  As stated in the HPI and otherwise negative.   BP 110/64 mmHg  Pulse 63  Ht 5\' 5"  (1.651 m)  Wt 121 lb (54.885 kg)  BMI 20.14 kg/m2  SpO2 99%  LMP 08/30/1989  Physical Examination: General: Well  developed, well nourished, NAD HEENT: OP clear, mucus membranes moist SKIN: warm, dry. No rashes. Neuro: No focal deficits Musculoskeletal: Muscle strength 5/5 all ext Psychiatric: Mood and affect normal Neck: No JVD, no carotid bruits, no thyromegaly, no lymphadenopathy. Lungs:Clear bilaterally, no wheezes, rhonci, crackles Cardiovascular: Regular rate and rhythm. No murmurs, gallops or rubs. Abdomen:Soft. Bowel sounds present. Non-tender.  Extremities: No lower extremity edema. Pulses are 2 + in the bilateral DP/PT.  Cardiac cath 05/02/07:  4. The left main is free of critical disease.  5. The left anterior descending artery demonstrates about a 10-20%  area of narrowing at the proximal aspect. In the distal vessel  well beyond the diagonal there is an area of 50-60% narrowing and  this occurs at a location of a 180-degree bend in the artery. This  could represent potentially a ruptured plaque but we tried to lay  it out in multiple views and in the best views it did not appear to  be disrupted. The distal LAD wrapped the apex and provided the  distal inferior wall.  6. The circumflex was a relatively small system that appeared to be  free of significant disease.  7. The right coronary artery is relatively small system that also  appeared to be free of significant disease.  CONCLUSIONS:  1. Moderate reduction in left ventricular function with an  anteroapical distal inferior wall motion abnormality.  2. Overall smooth coronary vessels with abnormality in the distal left  anterior descending artery as noted above.  Echo 09/20/13: Left ventricle: The cavity size was normal. Systolic function was normal. Wall motion was normal; there were no regional wall motion abnormalities. No valve disease  EKG:  EKG is ordered today. The ekg ordered today demonstrates NSR, rate 63 bpm. Incomplete RBBB.   Recent Labs: 10/21/2015: ALT 15; BUN 18; Creat 0.78; Creatinine 0.78; Hemoglobin 14.1;  Platelets 287; Potassium 4.3; Sodium 137; TSH 1.72   Lipid Panel Followed in primary care   Wt Readings from Last 3 Encounters:  01/23/16 121 lb (54.885 kg)  08/07/15 122 lb (55.339 kg)  01/21/15 119 lb 9.6 oz (54.25 kg)     Other studies Reviewed: Additional studies/ records that were reviewed today include: . Review of the above records demonstrates:    Assessment and Plan:   1. CAD: No recent chest pain to suggest angina. She has been on an ASA. She does not tolerate beta blockers. Echo 09/20/13 with  normal LV size and function.  Nuclear stress test March 2016 without ischemia. BP has been well controlled.   2. Hyperlipidemia: Tolerating Crestor. Lipids followed in primary care.    Current medicines are reviewed at length with the patient today.  The patient does not have concerns regarding medicines.  The following changes have been made:  no change  Labs/ tests ordered today include:   Orders Placed This Encounter  Procedures  . EKG 12-Lead    Disposition:   FU with me in 12  months  Signed, Lauree Chandler, MD 01/23/2016 10:51 AM    [facility] [place], [place],   [address] Phone: [phone]; Fax: [phone]

## 2016-01-23 NOTE — Patient Instructions (Signed)

## 2016-01-27 ENCOUNTER — Ambulatory Visit: Payer: Medicare Other | Admitting: Certified Nurse Midwife

## 2016-02-12 ENCOUNTER — Ambulatory Visit (INDEPENDENT_AMBULATORY_CARE_PROVIDER_SITE_OTHER): Payer: Medicare Other | Admitting: Certified Nurse Midwife

## 2016-02-12 ENCOUNTER — Encounter: Payer: Self-pay | Admitting: Certified Nurse Midwife

## 2016-02-12 VITALS — BP 114/60 | HR 72 | Resp 16 | Ht 65.25 in | Wt 122.0 lb

## 2016-02-12 DIAGNOSIS — Z0000 Encounter for general adult medical examination without abnormal findings: Secondary | ICD-10-CM

## 2016-02-12 DIAGNOSIS — Z01419 Encounter for gynecological examination (general) (routine) without abnormal findings: Secondary | ICD-10-CM

## 2016-02-12 NOTE — Progress Notes (Signed)
75 y.o. G68P0011 Married  Caucasian Fe here for annual exam. Menopausal no HRT. Denies vaginal bleeding or dryness. Sees PCP Dr. Radene Gunning yearly for aex/labs and medication management for anxiety, cholesterol,Hypothyroid, all medications stable. No health issues. Still teaching music.  Took trip to [place] and [place]!  Patient's last menstrual period was 08/30/1989.          Sexually active: No.  The current method of family planning is post menopausal status.    Exercising: Yes.    walking,classes at [facility], tai chi Smoker:  no  Health Maintenance: Pap:  2011 MMG:  06-20-15 category b density, birads 1:neg Colonoscopy:  2015 polyps f/u 82yr BMD:   2016 osteoporosis TDaP:  2014 Shingles:  Had done Pneumonia: had done Hep C and HIV: not done Labs: pcp Self breast exam: done occ   reports that she has quit smoking. She has never used smokeless tobacco. She reports that she drinks about 3.0 - 3.6 oz of alcohol per week. She reports that she does not use illicit drugs.  Past Medical History  Diagnosis Date  . CAD (coronary artery disease)   . Hypercholesteremia   . Osteoporosis   . Thyroid disease     hypothyroidism  . Depression   . [place] infarction [place]   . Allergy   . Shingles     Past Surgical History  Procedure Laterality Date  . Dilation and curettage of uterus    . Cardiac catheterization    . Fractured jaw    . Wisdom tooth extraction    . Cataract extraction Right 11/18/14    Current Outpatient Prescriptions  Medication Sig Dispense Refill  . alendronate (FOSAMAX) 70 MG tablet Take 1 tablet (70 mg total) by mouth every 7 (seven) days. Take with a full glass of water on an empty stomach. Stay upright for an additional 30 minutes and don't eat or drink anything for 30 minutes 12 tablet 3  . aspirin 81 MG tablet Take 81 mg by mouth daily.      . Calcium Citrate-Vitamin D (CALCIUM + D PO) Take by mouth. + magnesium & zinc    . escitalopram (LEXAPRO) 10 MG tablet Take 10  mg by mouth daily.    . Fish Oil-Canola Oil-Vit D3 LIQD Take by mouth daily. 1 tsp daily    . levothyroxine (SYNTHROID, LEVOTHROID) 50 MCG tablet Take 50 mcg by mouth daily.    . Multiple Vitamin (MULTIVITAMIN) tablet Take by mouth daily.     . rosuvastatin (CRESTOR) 5 MG tablet Take 5 mg by mouth daily.    . nitroGLYCERIN (NITROSTAT) 0.4 MG SL tablet Place 1 tablet (0.4 mg total) under the tongue every 5 (five) minutes as needed. (Patient not taking: Reported on 02/12/2016) 25 tablet 6   No current facility-administered medications for this visit.    Family History  Problem Relation Age of Onset  . Hypertension Mother   . Stroke Mother   . Heart disease Mother   . Hypertension Maternal Aunt   . Stroke Maternal Aunt   . Kidney disease Father   . Alzheimer's disease Father   . Colon cancer Neg Hx   . Pancreatic cancer Neg Hx   . Stomach cancer Neg Hx   . Esophageal cancer Neg Hx   . Rectal cancer Neg Hx     ROS:  Pertinent items are noted in HPI.  Otherwise, a comprehensive ROS was negative.  Exam:   BP 114/60 mmHg  Pulse 72  Resp 16  Ht 5' 5.25" (1.657 m)  Wt 122 lb (55.339 kg)  BMI 20.16 kg/m2  LMP 08/30/1989 Height: 5' 5.25" (165.7 cm) Ht Readings from Last 3 Encounters:  02/12/16 5' 5.25" (1.657 m)  01/23/16 '5\' 5"'  (1.651 m)  01/21/15 5' 5.5" (1.664 m)    General appearance: alert, cooperative and appears stated age Head: Normocephalic, without obvious abnormality, atraumatic Neck: no adenopathy, supple, symmetrical, trachea midline and thyroid normal to inspection and palpation Lungs: clear to auscultation bilaterally Breasts: normal appearance, no masses or tenderness, No nipple retraction or dimpling, No nipple discharge or bleeding, No axillary or supraclavicular adenopathy Heart: regular rate and rhythm Abdomen: soft, non-tender; no masses,  no organomegaly Extremities: extremities normal, atraumatic, no cyanosis or edema Skin: Skin color, texture, turgor  normal. No rashes or lesions Lymph nodes: Cervical, supraclavicular, and axillary nodes normal. No abnormal inguinal nodes palpated Neurologic: Grossly normal   Pelvic: External genitalia:  no lesions              Urethra:  normal appearing urethra with no masses, tenderness or lesions              Bartholin's and Skene's: normal                 Vagina: normal appearing vagina with normal color and discharge, no lesions              Cervix: multiparous appearance, no cervical motion tenderness and no lesions              Pap taken: No. Bimanual Exam:  Uterus:  normal size, contour, position, consistency, mobility, non-tender              Adnexa: normal adnexa and no mass, fullness, tenderness               Rectovaginal: Confirms               Anus:  normal sphincter tone, no lesions  Chaperone present: yes  A:  Well Woman with normal exam  Menopausal no HRT  Hypothyroid/cholesterol/anxiety with PCP management  P:   Reviewed health and wellness pertinent to exam  Aware of need to evaluate if vaginal bleeding  Continue follow up with PCP  Pap smear as above not taken   counseled on breast self exam, mammography screening, adequate intake of calcium and vitamin D, diet and exercise  return annually or prn  An After Visit Summary was printed and given to the patient.

## 2016-02-12 NOTE — Patient Instructions (Signed)

## 2016-02-13 LAB — HEPATITIS C ANTIBODY: HCV AB: NEGATIVE

## 2016-02-13 LAB — HIV ANTIBODY (ROUTINE TESTING W REFLEX): HIV 1&2 Ab, 4th Generation: NONREACTIVE

## 2016-02-16 NOTE — Progress Notes (Signed)
Encounter reviewed by Dr. Brook Amundson C. Silva.  

## 2016-08-03 ENCOUNTER — Other Ambulatory Visit: Payer: Self-pay | Admitting: Certified Nurse Midwife

## 2016-08-03 DIAGNOSIS — Z1231 Encounter for screening mammogram for malignant neoplasm of breast: Secondary | ICD-10-CM

## 2016-09-03 ENCOUNTER — Ambulatory Visit
Admission: RE | Admit: 2016-09-03 | Discharge: 2016-09-03 | Disposition: A | Discharge status: Home / Self Care | Payer: Medicare Other | Source: Ambulatory Visit | Attending: Certified Nurse Midwife | Admitting: Certified Nurse Midwife

## 2016-09-03 DIAGNOSIS — Z1231 Encounter for screening mammogram for malignant neoplasm of breast: Secondary | ICD-10-CM

## 2016-10-19 ENCOUNTER — Other Ambulatory Visit: Payer: Self-pay

## 2016-10-19 NOTE — Telephone Encounter (Signed)
Medication refill request: Alendronate Sodium 70mg  Last AEX:  02/12/16 DL Next AEX: 02/15/17 Last MMG (if hormonal medication request): 09/03/16 BIRADS 1 negative Refill authorized: 10/29/15 #12 w/3 refills; today please advise

## 2016-10-20 MED ORDER — ALENDRONATE SODIUM 70 MG PO TABS: 70.0000 mg | ORAL_TABLET | ORAL | 0 refills | Status: DC

## 2016-10-20 NOTE — Telephone Encounter (Signed)
Prescription for Fosamax was faxed to [facility].

## 2016-10-22 ENCOUNTER — Encounter: Payer: Self-pay | Admitting: Internal Medicine

## 2016-11-11 DIAGNOSIS — IMO0002 Reserved for concepts with insufficient information to code with codable children: Secondary | ICD-10-CM | POA: Insufficient documentation

## 2016-11-11 LAB — IFOBT (OCCULT BLOOD): IMMUNOLOGICAL FECAL OCCULT BLOOD TEST: NEGATIVE

## 2017-01-09 ENCOUNTER — Other Ambulatory Visit: Payer: Self-pay | Admitting: Certified Nurse Midwife

## 2017-01-10 NOTE — Telephone Encounter (Signed)
Medication refill request: Fosamax  Last AEX:  02-12-16  Next AEX: 02-23-17  Last MMG (if hormonal medication request): 09-03-16 WNL  Refill authorized: please advise   Sending to [place] since DL is off today

## 2017-02-15 ENCOUNTER — Ambulatory Visit: Payer: Medicare Other | Admitting: Certified Nurse Midwife

## 2017-02-23 ENCOUNTER — Encounter: Payer: Self-pay | Admitting: Certified Nurse Midwife

## 2017-02-23 ENCOUNTER — Ambulatory Visit (INDEPENDENT_AMBULATORY_CARE_PROVIDER_SITE_OTHER): Payer: Medicare Other | Admitting: Certified Nurse Midwife

## 2017-02-23 ENCOUNTER — Other Ambulatory Visit (HOSPITAL_COMMUNITY)
Admission: RE | Admit: 2017-02-23 | Discharge: 2017-02-23 | Disposition: A | Discharge status: Home / Self Care | Payer: Medicare Other | Source: Ambulatory Visit | Attending: Obstetrics & Gynecology | Admitting: Obstetrics & Gynecology

## 2017-02-23 VITALS — BP 124/68 | HR 64 | Ht 65.5 in | Wt 131.0 lb

## 2017-02-23 DIAGNOSIS — Z124 Encounter for screening for malignant neoplasm of cervix: Secondary | ICD-10-CM | POA: Diagnosis present

## 2017-02-23 DIAGNOSIS — Z01419 Encounter for gynecological examination (general) (routine) without abnormal findings: Secondary | ICD-10-CM

## 2017-02-23 DIAGNOSIS — M810 Age-related osteoporosis without current pathological fracture: Secondary | ICD-10-CM | POA: Diagnosis not present

## 2017-02-23 NOTE — Patient Instructions (Signed)

## 2017-02-23 NOTE — Progress Notes (Signed)
76 y.o. G63P0011 Married  Caucasian Fe here for annual exam. Menopausal no HRT. Denies vaginal bleeding or vaginal dryness. Occasional scant leaking with full bladder, otherwise no issues. 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 Visited [place]! Sees Dr. Radene Gunning yearly for aex,labs, cholesterol , hypothyroidism, Osteoporosis management, all stable. Social stress with spouses surgery complications of infection in site in spine, but healing well now. 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  Patient's last menstrual period was 08/30/1989 (approximate).          Sexually active: No.  The current method of family planning is post menopausal status.    Exercising: Yes.    physical activity classes, walking, running Smoker:  no  Health Maintenance: Pap:  2011 History of Abnormal Pap: no MMG:  09-03-16 category b density birads 1:neg Self Breast exams: no Colonoscopy:  2015 polyps f/u 33yrs BMD:   2016 osteoporosis TDaP:  2014 Shingles: had done Pneumonia: had done Hep C and HIV: both neg 2017 Labs: pcp   reports that she has quit smoking. She has never used smokeless tobacco. She reports that she drinks about 3.0 oz of alcohol per week . She reports that she does not use drugs.  Past Medical History:  Diagnosis Date  . Allergy   . CAD (coronary artery disease)   . Depression   . Hypercholesteremia   . Myocardial infarction ([place])   . Ocular migraine   . Osteoporosis   . Shingles   . Thyroid disease    hypothyroidism    Past Surgical History:  Procedure Laterality Date  . CARDIAC CATHETERIZATION    . CATARACT EXTRACTION Right 11/18/14  . DILATION AND CURETTAGE OF UTERUS    . fractured jaw    . WISDOM TOOTH EXTRACTION      Current Outpatient Prescriptions  Medication Sig Dispense Refill  . alendronate (FOSAMAX) 70 MG tablet TAKE 1 TAB ONCE A WEEK, AT LEAST 30 MIN BEFORE 1ST FOOD.DO NOT LIE DOWN FOR 30 MIN AFTER TAKING. 12 tablet 0  . aspirin 81 MG tablet Take 81 mg by mouth  daily.      . Calcium Citrate-Vitamin D (CALCIUM + D PO) Take by mouth. + magnesium & zinc    . Cholecalciferol (VITAMIN D PO) Take by mouth daily.    Marland Kitchen escitalopram (LEXAPRO) 10 MG tablet Take 10 mg by mouth daily.    . Fish Oil-Canola Oil-Vit D3 LIQD Take by mouth as needed. 1 tsp daily    . levothyroxine (SYNTHROID, LEVOTHROID) 50 MCG tablet Take 50 mcg by mouth daily.    . Multiple Vitamin (MULTIVITAMIN) tablet Take by mouth daily.     . nitroGLYCERIN (NITROSTAT) 0.4 MG SL tablet Place 1 tablet (0.4 mg total) under the tongue every 5 (five) minutes as needed. 25 tablet 6  . rosuvastatin (CRESTOR) 5 MG tablet Take 5 mg by mouth daily.     No current facility-administered medications for this visit.     Family History  Problem Relation Age of Onset  . Hypertension Mother   . Stroke Mother   . Heart disease Mother   . Hypertension Maternal Aunt   . Stroke Maternal Aunt   . Kidney disease Father   . Alzheimer's disease Father   . Colon cancer Neg Hx   . Pancreatic cancer Neg Hx   . Stomach cancer Neg Hx   . Esophageal cancer Neg Hx   . Rectal cancer Neg Hx     ROS:  Pertinent items  are noted in HPI.  Otherwise, a comprehensive ROS was negative.  Exam:   BP 124/68 (BP Location: Right Arm, Patient Position: Sitting, Cuff Size: Normal)   Pulse 64   Ht 5' 5.5" (1.664 m)   Wt 131 lb (59.4 kg)   LMP 08/30/1989 (Approximate)   BMI 21.47 kg/m  Height: 5' 5.5" (166.4 cm) Ht Readings from Last 3 Encounters:  02/23/17 5' 5.5" (1.664 m)  02/12/16 5' 5.25" (1.657 m)  01/23/16 5\' 5"  (1.651 m)    General appearance: alert, cooperative and appears stated age Head: Normocephalic, without obvious abnormality, atraumatic Neck: no adenopathy, supple, symmetrical, trachea midline and thyroid normal to inspection and palpation Lungs: clear to auscultation bilaterally Breasts: normal appearance, no masses or tenderness, No nipple retraction or dimpling, No nipple discharge or bleeding, No  axillary or supraclavicular adenopathy Heart: regular rate and rhythm Abdomen: soft, non-tender; no masses,  no organomegaly Extremities: extremities normal, atraumatic, no cyanosis or edema Skin: Skin color, texture, turgor normal. No rashes or lesions Lymph nodes: Cervical, supraclavicular, and axillary nodes normal. No abnormal inguinal nodes palpated Neurologic: Grossly normal   Pelvic: External genitalia:  no lesions, normal female, atrophic              Urethra:  normal appearing urethra with no masses, tenderness or lesions              Bartholin's and Skene's: normal                 Vagina: atrophic appearing vagina with normal color and discharge, no lesions              Cervix: multiparous appearance, no bleeding following Pap, no cervical motion tenderness and no lesions              Pap taken: Yes.   Bimanual Exam:  Uterus:  normal size, contour, position, consistency, mobility, non-tender              Adnexa: normal adnexa and no mass, fullness, tenderness               Rectovaginal: Confirms               Anus:  normal sphincter tone, no lesions  Chaperone present: yes  A:  Well Woman with normal exam  Menopausal no HRT  Hypothyroidism/cholesterol and osteoporosis management with PCP    P:   Reviewed health and wellness pertinent to exam  Aware of need to evaluate if vaginal bleeding  Continue follow up with PCP as indicated  Pap smear: yes   counseled on breast self exam, mammography screening, adequate intake of calcium and vitamin D, diet and exercise, Kegel's exercises and not holding urine for long periods of time.  return annually or prn  An After Visit Summary was printed and given to the patient.

## 2017-02-24 LAB — CYTOLOGY - PAP: DIAGNOSIS: NEGATIVE

## 2017-03-23 ENCOUNTER — Ambulatory Visit (INDEPENDENT_AMBULATORY_CARE_PROVIDER_SITE_OTHER): Payer: Medicare Other | Admitting: Cardiovascular Disease

## 2017-03-23 ENCOUNTER — Encounter (INDEPENDENT_AMBULATORY_CARE_PROVIDER_SITE_OTHER): Payer: Self-pay

## 2017-03-23 ENCOUNTER — Encounter: Payer: Self-pay | Admitting: Cardiovascular Disease

## 2017-03-23 VITALS — BP 120/62 | HR 74 | Ht 65.5 in | Wt 130.8 lb

## 2017-03-23 DIAGNOSIS — E7800 Pure hypercholesterolemia, unspecified: Secondary | ICD-10-CM

## 2017-03-23 DIAGNOSIS — I2510 Atherosclerotic heart disease of native coronary artery without angina pectoris: Secondary | ICD-10-CM | POA: Diagnosis not present

## 2017-03-23 NOTE — Progress Notes (Signed)
Chief Complaint  Patient presents with  . Follow-up    CAD    History of Present Illness: 76 yo female with history of CAD, HLD who is here today for cardiac follow up. She was previously followed by Dr. Lia Foyer. She presented with a NSTEMI in September 2008 and Dr. Lia Foyer performed cath which showed 50-60% mid LAD stenosis. The Circumflex and RCA had no significant disease. She has been managed medically since then. She has not tolerated high dose statins due to muscle pains but has tolerated low dose Crestor. She has not tolerated beta blockers due to fatigue.  Echo 09/20/13 with normal LV function and no valve disease. Nuclear stress test in March 2016 with no ischemia.   She is here today for follow up. The patient denies any chest pain, dyspnea, palpitations, lower extremity edema, orthopnea, PND, dizziness, near syncope or syncope.    Primary Care Physician: Prince Solian, MD  Past Medical History:  Diagnosis Date  . Allergy   . CAD (coronary artery disease)   . Depression   . Hypercholesteremia   . Myocardial infarction ([place])   . Ocular migraine   . Osteoporosis   . Shingles   . Thyroid disease    hypothyroidism    Past Surgical History:  Procedure Laterality Date  . CARDIAC CATHETERIZATION    . CATARACT EXTRACTION Right 11/18/14  . DILATION AND CURETTAGE OF UTERUS    . fractured jaw    . WISDOM TOOTH EXTRACTION      Current Outpatient Prescriptions  Medication Sig Dispense Refill  . alendronate (FOSAMAX) 70 MG tablet TAKE 1 TAB ONCE A WEEK, AT LEAST 30 MIN BEFORE 1ST FOOD.DO NOT LIE DOWN FOR 30 MIN AFTER TAKING. 12 tablet 0  . aspirin 81 MG tablet Take 81 mg by mouth daily.      . Calcium Citrate-Vitamin D (CALCIUM + D PO) Take by mouth. + magnesium & zinc    . Cholecalciferol (VITAMIN D PO) Take by mouth daily.    Marland Kitchen escitalopram (LEXAPRO) 10 MG tablet Take 10 mg by mouth daily.    . Fish Oil-Canola Oil-Vit D3 LIQD Take by mouth as needed. 1 tsp daily    .  levothyroxine (SYNTHROID, LEVOTHROID) 50 MCG tablet Take 50 mcg by mouth daily.    . Multiple Vitamin (MULTIVITAMIN) tablet Take by mouth daily.     . nitroGLYCERIN (NITROSTAT) 0.4 MG SL tablet Place 1 tablet (0.4 mg total) under the tongue every 5 (five) minutes as needed. 25 tablet 6  . rosuvastatin (CRESTOR) 5 MG tablet Take 5 mg by mouth daily.     No current facility-administered medications for this visit.     Allergies  Allergen Reactions  . Darvon [Propoxyphene Hcl]     Bil hearing decreases, occular migraines  . Prednisone     bil hearing decreased and occular migraines  . Septra [Sulfamethoxazole-Trimethoprim]     Pt unsure of reaction.    Social History   Social History  . Marital status: Married    Spouse name: N/A  . Number of children: 1  . Years of education: N/A   Occupational History  . Part time music teacher    Social History Main Topics  . Smoking status: Former Research scientist (life sciences)  . Smokeless tobacco: Never Used  . Alcohol use 3.0 oz/week    5 Glasses of wine per week  . Drug use: No  . Sexual activity: No   Other Topics Concern  . Not on file  Social History Narrative  . No narrative on file    Family History  Problem Relation Age of Onset  . Hypertension Mother   . Stroke Mother   . Heart disease Mother   . Kidney disease Father   . Alzheimer's disease Father   . Hypertension Maternal Aunt   . Stroke Maternal Aunt   . Colon cancer Neg Hx   . Pancreatic cancer Neg Hx   . Stomach cancer Neg Hx   . Esophageal cancer Neg Hx   . Rectal cancer Neg Hx     Review of Systems:  As stated in the HPI and otherwise negative.   BP 120/62   Pulse 74   Ht 5' 5.5" (1.664 m)   Wt 130 lb 12.8 oz (59.3 kg)   LMP 08/30/1989 (Approximate)   SpO2 98%   BMI 21.44 kg/m   Physical Examination: General: Well developed, well nourished, NAD  HEENT: OP clear, mucus membranes moist  SKIN: warm, dry. No rashes. Neuro: No focal deficits  Musculoskeletal: Muscle  strength 5/5 all ext  Psychiatric: Mood and affect normal  Neck: No JVD, no carotid bruits, no thyromegaly, no lymphadenopathy.  Lungs:Clear bilaterally, no wheezes, rhonci, crackles Cardiovascular: Regular rate and rhythm. No murmurs, gallops or rubs. Abdomen:Soft. Bowel sounds present. Non-tender.  Extremities: No lower extremity edema. Pulses are 2 + in the bilateral DP/PT.  Cardiac cath 05/02/07:  4. The left main is free of critical disease.  5. The left anterior descending artery demonstrates about a 10-20%  area of narrowing at the proximal aspect. In the distal vessel  well beyond the diagonal there is an area of 50-60% narrowing and  this occurs at a location of a 180-degree bend in the artery. This  could represent potentially a ruptured plaque but we tried to lay  it out in multiple views and in the best views it did not appear to  be disrupted. The distal LAD wrapped the apex and provided the  distal inferior wall.  6. The circumflex was a relatively small system that appeared to be  free of significant disease.  7. The right coronary artery is relatively small system that also  appeared to be free of significant disease.  CONCLUSIONS:  1. Moderate reduction in left ventricular function with an  anteroapical distal inferior wall motion abnormality.  2. Overall smooth coronary vessels with abnormality in the distal left  anterior descending artery as noted above.  Echo 09/20/13: Left ventricle: The cavity size was normal. Systolic function was normal. Wall motion was normal; there were no regional wall motion abnormalities. No valve disease  EKG:  EKG is  ordered today. The ekg ordered today demonstrates  NSR, rate 67 bpm. Poor R wave progression precordial leads.   Recent Labs: No results found for requested labs within last 8760 hours.   Lipid Panel Followed in primary care   Wt Readings from Last 3 Encounters:  03/23/17 130 lb 12.8 oz (59.3 kg)  02/23/17 131  lb (59.4 kg)  02/12/16 122 lb (55.3 kg)     Other studies Reviewed: Additional studies/ records that were reviewed today include: . Review of the above records demonstrates:    Assessment and Plan:   1. CAD without angina: She has no chest pain suggestive of angina. She does not tolerate beta blockers due to fatigue. Echo in 2016 with normal LV size and function. Stress test 2016 without ischemia. Will continue ASA and low dose statin.    2. Hyperlipidemia:  She is tolerating low dose Crestor. Lipids followed in primary care. LDL near goal of 70 last year.     Current medicines are reviewed at length with the patient today.  The patient does not have concerns regarding medicines.  The following changes have been made:  no change  Labs/ tests ordered today include:   No orders of the defined types were placed in this encounter.   Disposition:   FU with me in 12  months  Signed, Lauree Chandler, MD 03/23/2017 9:12 AM    [facility] [address], [place], [place]  [address] Phone: [phone]; Fax: [phone]

## 2017-03-23 NOTE — Patient Instructions (Signed)

## 2017-03-30 DIAGNOSIS — S72009A Fracture of unspecified part of neck of unspecified femur, initial encounter for closed fracture: Secondary | ICD-10-CM

## 2017-03-30 HISTORY — DX: Fracture of unspecified part of neck of unspecified femur, initial encounter for closed fracture: S72.009A

## 2017-05-09 ENCOUNTER — Telehealth: Payer: Self-pay | Admitting: Obstetrics and Gynecology

## 2017-05-09 DIAGNOSIS — M810 Age-related osteoporosis without current pathological fracture: Secondary | ICD-10-CM

## 2017-05-09 NOTE — Telephone Encounter (Signed)
Please have her set up a DEXA for November, order placed.  3 months of the fosamax has been sent to get her past the DEXA appointment. Then we will review the DEXA and make recommendations.

## 2017-05-09 NOTE — Telephone Encounter (Signed)
Left message to call regarding scheduling DEXA -eh

## 2017-05-09 NOTE — Telephone Encounter (Signed)
Medication refill request: Fosamax  Last AEX:  02-23-17  Next AEX: 02-24-18  Last MMG (if hormonal medication request): 09-03-16 WNL Refill authorized: please advise

## 2017-05-11 NOTE — Telephone Encounter (Signed)
Left another message to call regarding refill

## 2017-05-12 NOTE — Telephone Encounter (Signed)
Patient returned Elaine's call.

## 2017-05-12 NOTE — Telephone Encounter (Signed)
Spoke with patient and informed that RX was sent in and that DEXA scan is due in November. Gave number for [facility]

## 2017-07-29 ENCOUNTER — Ambulatory Visit
Admission: RE | Admit: 2017-07-29 | Discharge: 2017-07-29 | Disposition: A | Discharge status: Home / Self Care | Payer: Medicare Other | Source: Ambulatory Visit | Attending: Obstetrics and Gynecology | Admitting: Obstetrics and Gynecology

## 2017-07-29 DIAGNOSIS — M810 Age-related osteoporosis without current pathological fracture: Secondary | ICD-10-CM

## 2017-08-02 ENCOUNTER — Other Ambulatory Visit: Payer: Self-pay | Admitting: Obstetrics and Gynecology

## 2017-08-02 NOTE — Telephone Encounter (Signed)
Medication refill request: Fosamax  Last AEX:  02-23-17  Next AEX: 02-24-18  Last MMG (if hormonal medication request):  09-03-16 WNL  Refill authorized: please advise

## 2018-01-31 ENCOUNTER — Encounter: Payer: Self-pay | Admitting: Internal Medicine

## 2018-02-24 ENCOUNTER — Other Ambulatory Visit: Payer: Self-pay

## 2018-02-24 ENCOUNTER — Ambulatory Visit (INDEPENDENT_AMBULATORY_CARE_PROVIDER_SITE_OTHER): Payer: Medicare Other | Admitting: Certified Nurse Midwife

## 2018-02-24 ENCOUNTER — Encounter: Payer: Self-pay | Admitting: Certified Nurse Midwife

## 2018-02-24 VITALS — BP 118/60 | HR 68 | Resp 16 | Ht 65.5 in | Wt 131.0 lb

## 2018-02-24 DIAGNOSIS — M810 Age-related osteoporosis without current pathological fracture: Secondary | ICD-10-CM

## 2018-02-24 DIAGNOSIS — Z01419 Encounter for gynecological examination (general) (routine) without abnormal findings: Secondary | ICD-10-CM

## 2018-02-24 DIAGNOSIS — N952 Postmenopausal atrophic vaginitis: Secondary | ICD-10-CM | POA: Diagnosis not present

## 2018-02-24 NOTE — Patient Instructions (Signed)

## 2018-02-24 NOTE — Progress Notes (Signed)
77 y.o. G39P0011 Married  Caucasian Fe here for annual exam. Menopausal no HRT. Denies vaginal bleeding or problems. Sees Dr. Radene Gunning for aex and labs, continues with medication management of cholesterol and hypothyroid,anxiety.PCP does IFOB yearly. All stable per patient. No problems taking Fosamax at this time. BMD due in 07/30/18. 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 No falls. No health issues today. [place]  Patient's last menstrual period was 08/30/1989 (approximate).          Sexually active: No.  The current method of family planning is post menopausal status.    Exercising: Yes.    walking, lifting weights Smoker:  no  Health Maintenance: Pap:  2011, 02-23-17 neg History of Abnormal Pap: no MMG:  09-03-16 category b density birads 1:neg Self Breast exams: no Colonoscopy:  2015 polyps f/u 18yrs, scheduled for 8/19 BMD:   2018 osteoporosis TDaP:  2014 Shingles: had done Pneumonia: had done Hep C and HIV: both neg 2017 Labs: no   reports that she has quit smoking. She has never used smokeless tobacco. She reports that she drinks about 3.0 oz of alcohol per week. She reports that she does not use drugs.  Past Medical History:  Diagnosis Date  . Allergy   . CAD (coronary artery disease)   . Depression   . Hypercholesteremia   . Myocardial infarction ([place])   . Ocular migraine   . Osteoporosis   . Shingles   . Thyroid disease    hypothyroidism    Past Surgical History:  Procedure Laterality Date  . CARDIAC CATHETERIZATION    . CATARACT EXTRACTION Right 11/18/14  . DILATION AND CURETTAGE OF UTERUS    . fractured jaw    . WISDOM TOOTH EXTRACTION      Current Outpatient Medications  Medication Sig Dispense Refill  . alendronate (FOSAMAX) 70 MG tablet TAKE 1 TAB ONCE A WEEK, AT LEAST 30 MIN BEFORE 1ST FOOD.DO NOT LIE DOWN FOR 30 MIN AFTER TAKING. 12 tablet 2  . aspirin 81 MG tablet Take 81 mg by mouth daily.       Marland Kitchen CALCIUM PO Take by mouth.    . Cholecalciferol (VITAMIN D PO) Take by mouth daily.    Marland Kitchen escitalopram (LEXAPRO) 10 MG tablet Take 10 mg by mouth daily.    . Fish Oil-Canola Oil-Vit D3 LIQD Take by mouth as needed. 1 tsp daily    . levothyroxine (SYNTHROID, LEVOTHROID) 50 MCG tablet Take 50 mcg by mouth daily.    . Multiple Vitamin (MULTIVITAMIN) tablet Take by mouth daily.     . nitroGLYCERIN (NITROSTAT) 0.4 MG SL tablet Place 1 tablet (0.4 mg total) under the tongue every 5 (five) minutes as needed. 25 tablet 6  . rosuvastatin (CRESTOR) 5 MG tablet Take 5 mg by mouth daily.     No current facility-administered medications for this visit.     Family History  Problem Relation Age of Onset  . Hypertension Mother   . Stroke Mother   . Heart disease Mother   . Kidney disease Father   . Alzheimer's disease Father   . Hypertension Maternal Aunt   . Stroke Maternal Aunt   . Colon cancer Neg Hx   . Pancreatic cancer Neg Hx   . Stomach cancer Neg Hx   . Esophageal cancer Neg Hx   . Rectal cancer Neg Hx     ROS:  Pertinent items are noted in HPI.  Otherwise, a comprehensive ROS was negative.  Exam:   BP 118/60   Pulse 68   Resp 16   Ht 5' 5.5" (1.664 m)   Wt 131 lb (59.4 kg)   LMP 08/30/1989 (Approximate)   BMI 21.47 kg/m  Height: 5' 5.5" (166.4 cm) Ht Readings from Last 3 Encounters:  02/24/18 5' 5.5" (1.664 m)  03/23/17 5' 5.5" (1.664 m)  02/23/17 5' 5.5" (1.664 m)    General appearance: alert, cooperative and appears stated age Head: Normocephalic, without obvious abnormality, atraumatic Neck: no adenopathy, supple, symmetrical, trachea midline and thyroid normal to inspection and palpation Lungs: clear to auscultation bilaterally Breasts: normal appearance, no masses or tenderness, No nipple retraction or dimpling, No nipple discharge or bleeding, No axillary or supraclavicular adenopathy Heart: regular rate and rhythm Abdomen: soft, non-tender; no masses,  no  organomegaly Extremities: extremities normal, atraumatic, no cyanosis or edema Skin: Skin color, texture, turgor normal. No rashes or lesions Lymph nodes: Cervical, supraclavicular, and axillary nodes normal. No abnormal inguinal nodes palpated Neurologic: Grossly normal   Pelvic: External genitalia:  no lesions              Urethra:  normal appearing urethra with no masses, tenderness or lesions              Bartholin's and Skene's: normal                 Vagina: normal appearing vagina with normal color and discharge, no lesions              Cervix: no cervical motion tenderness, no lesions and normal appearance              Pap taken: No. Bimanual Exam:  Uterus:  normal size, contour, position, consistency, mobility, non-tender and anteverted              Adnexa: normal adnexa and no mass, fullness, tenderness               Rectovaginal: Confirms               Anus:  normal sphincter tone, no lesions  Chaperone present: yes  A:  Well Woman with normal exam  Menopausal no HRT  Osteoporosis with Fosamax use  Atrophic vaginitis noted  Cholesterol,hypothyroid, depression with PCP management  Mammogram overdue    P:   Reviewed health and wellness pertinent to exam  Aware of need to advise if vaginal bleeding  Discussed will re-evaluate Fosamax use after BMD which is due in 12/19. Continue until then.  Discussed vaginal atrophy/dryness noted and recommend Coconut oil use for dryness. Instructions given and area of dryness shown to patient with mirror.  Continue follow up with PCP as indicated  Patient will call to schedule mammogram  Pap smear: no   counseled on breast self exam, mammography screening, feminine hygiene, osteoporosis, adequate intake of calcium and vitamin D, diet and exercise  return annually or prn  An After Visit Summary was printed and given to the patient.

## 2018-03-15 ENCOUNTER — Ambulatory Visit (AMBULATORY_SURGERY_CENTER): Payer: Self-pay

## 2018-03-15 VITALS — Ht 66.0 in | Wt 132.6 lb

## 2018-03-15 DIAGNOSIS — Z86010 Personal history of colonic polyps: Secondary | ICD-10-CM

## 2018-03-15 MED ORDER — NA SULFATE-K SULFATE-MG SULF 17.5-3.13-1.6 GM/177ML PO SOLN: 1.0000 | Freq: Once | ORAL | 0 refills | Status: AC

## 2018-03-15 NOTE — Progress Notes (Signed)
Per pt, no allergies to soy or egg products.Pt not taking any weight loss meds or using  O2 at home.  Pt refused emmi video. 

## 2018-03-16 ENCOUNTER — Encounter: Payer: Self-pay | Admitting: Internal Medicine

## 2018-03-28 ENCOUNTER — Ambulatory Visit (AMBULATORY_SURGERY_CENTER): Payer: Medicare Other | Admitting: Internal Medicine

## 2018-03-28 ENCOUNTER — Encounter: Payer: Self-pay | Admitting: Internal Medicine

## 2018-03-28 VITALS — BP 135/54 | HR 59 | Temp 97.3°F | Resp 13 | Ht 66.0 in | Wt 132.0 lb

## 2018-03-28 DIAGNOSIS — D122 Benign neoplasm of ascending colon: Secondary | ICD-10-CM | POA: Diagnosis not present

## 2018-03-28 DIAGNOSIS — Z86010 Personal history of colonic polyps: Secondary | ICD-10-CM | POA: Diagnosis present

## 2018-03-28 DIAGNOSIS — D123 Benign neoplasm of transverse colon: Secondary | ICD-10-CM

## 2018-03-28 MED ORDER — SODIUM CHLORIDE 0.9 % IV SOLN: 500.0000 mL | Freq: Once | INTRAVENOUS | Status: DC

## 2018-03-28 NOTE — Patient Instructions (Signed)
Please read handout on Polyps and Diverticulosis. Continue present medications.    YOU HAD AN ENDOSCOPIC PROCEDURE TODAY AT [facility]:   Refer to the procedure report that was given to you for any specific questions about what was found during the examination.  If the procedure report does not answer your questions, please call your gastroenterologist to clarify.  If you requested that your care partner not be given the details of your procedure findings, then the procedure report has been included in a sealed envelope for you to review at your convenience later.  YOU SHOULD EXPECT: Some feelings of bloating in the abdomen. Passage of more gas than usual.  Walking can help get rid of the air that was put into your GI tract during the procedure and reduce the bloating. If you had a lower endoscopy (such as a colonoscopy or flexible sigmoidoscopy) you may notice spotting of blood in your stool or on the toilet paper. If you underwent a bowel prep for your procedure, you may not have a normal bowel movement for a few days.  Please Note:  You might notice some irritation and congestion in your nose or some drainage.  This is from the oxygen used during your procedure.  There is no need for concern and it should clear up in a day or so.  SYMPTOMS TO REPORT IMMEDIATELY:   Following lower endoscopy (colonoscopy or flexible sigmoidoscopy):  Excessive amounts of blood in the stool  Significant tenderness or worsening of abdominal pains  Swelling of the abdomen that is new, acute  Fever of 100F or higher   For urgent or emergent issues, a gastroenterologist can be reached at any hour by calling [phone].   DIET:  We do recommend a small meal at first, but then you may proceed to your regular diet.  Drink plenty of fluids but you should avoid alcoholic beverages for 24 hours.  ACTIVITY:  You should plan to take it easy for the rest of today and you should NOT DRIVE or use  heavy machinery until tomorrow (because of the sedation medicines used during the test).    FOLLOW UP: Our staff will call the number listed on your records the next business day following your procedure to check on you and address any questions or concerns that you may have regarding the information given to you following your procedure. If we do not reach you, we will leave a message.  However, if you are feeling well and you are not experiencing any problems, there is no need to return our call.  We will assume that you have returned to your regular daily activities without incident.  If any biopsies were taken you will be contacted by phone or by letter within the next 1-3 weeks.  Please call us at [phone] if you have not heard about the biopsies in 3 weeks.    SIGNATURES/CONFIDENTIALITY: You and/or your care partner have signed paperwork which will be entered into your electronic medical record.  These signatures attest to the fact that that the information above on your After Visit Summary has been reviewed and is understood.  Full responsibility of the confidentiality of this discharge information lies with you and/or your care-partner.

## 2018-03-28 NOTE — Progress Notes (Signed)
Called to room to assist during endoscopic procedure.  Patient ID and intended procedure confirmed with present staff. Received instructions for my participation in the procedure from the performing physician.  

## 2018-03-28 NOTE — Progress Notes (Signed)
Report to PACU, RN, vss, BBS= Clear.  

## 2018-03-28 NOTE — Op Note (Signed)
[place] Patient Name: Savannah Freeman Procedure Date: 03/28/2018 1:49 PM MRN: [id number] Endoscopist: Docia Chuck. Henrene Pastor , MD Age: 77 Referring MD:  Date of Birth: 08-10-1941 Gender: Female Account #: [id number] Procedure:                Colonoscopy, with cold snare polypectomy x 3 Indications:              High risk colon cancer surveillance: Personal                            history of adenoma with villous component, High                            risk colon cancer surveillance: Personal history of                            sessile serrated colon polyp (less than 10 mm in                            size) with no dysplasia. Index exam elsewhere 2005                            negative. Subsequent exam here April 2015 Medicines:                Monitored Anesthesia Care Procedure:                Pre-Anesthesia Assessment:                           - Prior to the procedure, a History and Physical                            was performed, and patient medications and                            allergies were reviewed. The patient's tolerance of                            previous anesthesia was also reviewed. The risks                            and benefits of the procedure and the sedation                            options and risks were discussed with the patient.                            All questions were answered, and informed consent                            was obtained. Prior Anticoagulants: The patient has                            taken no previous anticoagulant or antiplatelet  agents. ASA Grade Assessment: II - A patient with                            mild systemic disease. After reviewing the risks                            and benefits, the patient was deemed in                            satisfactory condition to undergo the procedure.                           After obtaining informed consent, the colonoscope            was passed under direct vision. Throughout the                            procedure, the patient's blood pressure, pulse, and                            oxygen saturations were monitored continuously. The                            Colonoscope was introduced through the anus and                            advanced to the the cecum, identified by                            appendiceal orifice and ileocecal valve. The                            ileocecal valve, appendiceal orifice, and rectum                            were photographed. The quality of the bowel                            preparation was excellent. The colonoscopy was                            performed without difficulty. The patient tolerated                            the procedure well. The bowel preparation used was                            SUPREP. Scope In: 1:58:13 PM Scope Out: 2:12:10 PM Scope Withdrawal Time: 0 hours 11 minutes 17 seconds  Total Procedure Duration: 0 hours 13 minutes 57 seconds  Findings:                 Three polyps were found in the transverse colon and  ascending colon. The polyps were 3 to 6 mm in size.                            These polyps were removed with a cold snare.                            Resection and retrieval were complete.                           Diverticula were found in the sigmoid colon.                           The exam was otherwise without abnormality on                            direct and retroflexion views. Complications:            No immediate complications. Estimated blood loss:                            None. Estimated Blood Loss:     Estimated blood loss: none. Impression:               - Three 3 to 6 mm polyps in the transverse colon                            and in the ascending colon, removed with a cold                            snare. Resected and retrieved.                           - Diverticulosis in the sigmoid  colon.                           - The examination was otherwise normal on direct                            and retroflexion views. Recommendation:           - Repeat colonoscopy in 3 - 5 years for                            surveillance.                           - Patient has a contact number available for                            emergencies. The signs and symptoms of potential                            delayed complications were discussed with the                            patient. Return  to normal activities tomorrow.                            Written discharge instructions were provided to the                            patient.                           - Resume previous diet.                           - Continue present medications.                           - Await pathology results. Docia Chuck. Henrene Pastor, MD 03/28/2018 2:16:54 PM This report has been signed electronically.

## 2018-03-29 ENCOUNTER — Telehealth: Payer: Self-pay | Admitting: *Deleted

## 2018-03-29 NOTE — Telephone Encounter (Signed)
Patient calling back stating she found her wedding band.

## 2018-03-29 NOTE — Telephone Encounter (Signed)
  Follow up Call-  Call back number 03/28/2018  Post procedure Call Back phone  # [phone]  Permission to leave phone message Yes  Some recent data might be hidden     Patient questions:  Do you have a fever, pain , or abdominal swelling? No. Pain Score  0 *  Have you tolerated food without any problems? Yes.    Have you been able to return to your normal activities? Yes.    Do you have any questions about your discharge instructions: Diet   No. Medications  No. Follow up visit  No.  Do you have questions or concerns about your Care? No.  Actions: * If pain score is 4 or above: No action needed, pain <4.  Pt. Wonders if we have found her wedding band.   She states she doesn't remember whether or not she had it on at office yesterday.  She remembers taking it off, because it felt too tight.  Not sure where she took it off.  She states it is thin silver band.  I advised her that we will certainly call her if it turns up.

## 2018-04-10 ENCOUNTER — Encounter: Payer: Self-pay | Admitting: Internal Medicine

## 2018-04-17 ENCOUNTER — Ambulatory Visit: Payer: Medicare Other | Admitting: Cardiovascular Disease

## 2018-04-28 ENCOUNTER — Other Ambulatory Visit: Payer: Self-pay | Admitting: Obstetrics and Gynecology

## 2018-04-28 NOTE — Telephone Encounter (Signed)
Medication refill request: Fosamax Last AEX:  02/24/2018 Next AEX: 03/01/2019 Last MMG (if hormonal medication request): 09/03/2016 BI-RADS CATEGORY  1: Negative. Dexa done 07/29/2017 Refill authorized: #
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
## 2018-06-09 ENCOUNTER — Encounter: Payer: Self-pay | Admitting: Cardiovascular Disease

## 2018-06-09 ENCOUNTER — Ambulatory Visit: Payer: Medicare Other | Admitting: Cardiovascular Disease

## 2018-06-09 VITALS — BP 132/70 | HR 66 | Ht 66.0 in | Wt 131.0 lb

## 2018-06-09 DIAGNOSIS — I2510 Atherosclerotic heart disease of native coronary artery without angina pectoris: Secondary | ICD-10-CM

## 2018-06-09 DIAGNOSIS — E7800 Pure hypercholesterolemia, unspecified: Secondary | ICD-10-CM

## 2018-06-09 MED ORDER — NITROGLYCERIN 0.4 MG SL SUBL: 0.4000 mg | SUBLINGUAL_TABLET | SUBLINGUAL | 6 refills | Status: DC | PRN

## 2018-06-09 NOTE — Patient Instructions (Signed)

## 2019-02-22 ENCOUNTER — Other Ambulatory Visit: Payer: Self-pay

## 2019-02-26 ENCOUNTER — Ambulatory Visit: Payer: Medicare Other | Admitting: Certified Nurse Midwife
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
## 2019-03-01 ENCOUNTER — Ambulatory Visit: Payer: Medicare Other | Admitting: Certified Nurse Midwife

## 2019-03-07 ENCOUNTER — Other Ambulatory Visit: Payer: Self-pay

## 2019-03-07 ENCOUNTER — Encounter: Payer: Self-pay | Admitting: Certified Nurse Midwife

## 2019-03-07 ENCOUNTER — Ambulatory Visit (INDEPENDENT_AMBULATORY_CARE_PROVIDER_SITE_OTHER): Payer: Medicare Other | Admitting: Certified Nurse Midwife

## 2019-03-07 VITALS — BP 132/80 | HR 70 | Temp 97.1°F | Resp 16 | Ht 65.25 in | Wt 136.0 lb

## 2019-03-07 DIAGNOSIS — Z01419 Encounter for gynecological examination (general) (routine) without abnormal findings: Secondary | ICD-10-CM
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
## 2019-03-26 ENCOUNTER — Other Ambulatory Visit: Payer: Self-pay | Admitting: Certified Nurse Midwife

## 2019-03-26 DIAGNOSIS — Z1231 Encounter for screening mammogram for malignant neoplasm of breast: Secondary | ICD-10-CM

## 2019-03-27 ENCOUNTER — Ambulatory Visit
Admission: RE | Admit: 2019-03-27 | Discharge: 2019-03-27 | Disposition: A | Discharge status: Home / Self Care | Payer: Medicare Other | Source: Ambulatory Visit | Attending: Certified Nurse Midwife | Admitting: Certified Nurse Midwife

## 2019-03-27 ENCOUNTER — Other Ambulatory Visit: Payer: Self-pay

## 2019-03-27 DIAGNOSIS — Z1231 Encounter for screening mammogram for malignant neoplasm of breast: Secondary | ICD-10-CM

## 2019-04-09 ENCOUNTER — Other Ambulatory Visit: Payer: Self-pay | Admitting: Obstetrics and Gynecology

## 2019-04-09 NOTE — Telephone Encounter (Signed)
Medication refill request: fosamax Last AEX:  03-07-2019 Next AEX: 03-08-2019 Last MMG (if hormonal medication request): n/a Refill authorized: please approve if appropriate

## 2019-06-24 ENCOUNTER — Encounter: Payer: Self-pay | Admitting: Physician Assistant
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
## 2019-06-25 ENCOUNTER — Encounter: Payer: Self-pay | Admitting: Physician Assistant

## 2019-06-26 ENCOUNTER — Ambulatory Visit: Payer: Medicare Other | Admitting: Physician Assistant

## 2019-06-26 ENCOUNTER — Other Ambulatory Visit: Payer: Self-pay

## 2019-06-26 ENCOUNTER — Encounter: Payer: Self-pay | Admitting: Physician Assistant

## 2019-06-26 VITALS — BP 160/84 | HR 78 | Ht 65.25 in | Wt 139.8 lb

## 2019-06-26 DIAGNOSIS — E785 Hyperlipidemia, unspecified: Secondary | ICD-10-CM

## 2019-06-26 DIAGNOSIS — I2510 Atherosclerotic heart disease of native coronary artery without angina pectoris: Secondary | ICD-10-CM | POA: Diagnosis not present

## 2019-06-26 DIAGNOSIS — R030 Elevated blood-pressure reading, without diagnosis of hypertension: Secondary | ICD-10-CM

## 2019-06-26 MED ORDER — ROSUVASTATIN CALCIUM 10 MG PO TABS: 10.0000 mg | ORAL_TABLET | Freq: Every day | ORAL | 3 refills | Status: AC

## 2019-06-26 NOTE — Patient Instructions (Addendum)
Medication Instructions:  Your physician has recommended you make the following change in your medication:  1.  INCREASE the Crestor to 10 mg.  You may use the 5mg s you have at home by taking 2 of them daily.  I have sent in a new prescription to your pharmacy  *If you need a refill on your cardiac medications before your next appointment, please call your pharmacy*  Lab Work: 09/24/2019:  8:15 a.m.:  COME FASTING FOR CHOLESTEROL CHECK AND CMET  If you have labs (blood work) drawn today and your tests are completely normal, you will receive your results only by: Marland Kitchen MyChart Message (if you have MyChart) OR . A paper copy in the mail If you have any lab test that is abnormal or we need to change your treatment, we will call you to review the results.  Testing/Procedures: None ordered  Follow-Up: At [facility], you and your health needs are our priority.  As part of our continuing mission to provide you with exceptional heart care, we have created designated Provider Care Teams.  These Care Teams include your primary Cardiologist (physician) and Advanced Practice Providers (APPs -  Physician Assistants and Nurse Practitioners) who all work together to provide you with the care you need, when you need it.  Your next appointment:   12 months  The format for your next appointment:   In Person  Provider:   You may see Lauree Chandler, MD or one of the following Advanced Practice Providers on your designated Care Team:    Melina Copa, PA-C  Ermalinda Barrios, PA-C   Other Instructions  Please get a blood pressure cuff that goes on your arm. The wrist ones can be inaccurate. If possible, try to select one that also reports your heart rate. The [employer] brand is typically pretty reliable. To check your blood pressure, remain seated in a chair for a ew minutes minutes quietly beforehand, then check it. When you get a cuff, please get those readings over the course of a week and call us/send  in MyChart message with them for our review. Given your heart history, we are looking at a goal of less than 130/80. If you notice several days into your blood pressure readings that your numbers are frequently running higher than this, you can let us know sooner.

## 2019-09-24 ENCOUNTER — Other Ambulatory Visit: Payer: Self-pay

## 2019-09-24 ENCOUNTER — Other Ambulatory Visit: Payer: Medicare PPO | Admitting: *Deleted

## 2019-09-24 DIAGNOSIS — R030 Elevated blood-pressure reading, without diagnosis of hypertension: Secondary | ICD-10-CM

## 2019-09-24 DIAGNOSIS — E785 Hyperlipidemia, unspecified: Secondary | ICD-10-CM

## 2019-09-24 DIAGNOSIS — I2510 Atherosclerotic heart disease of native coronary artery without angina pectoris: Secondary | ICD-10-CM

## 2019-09-24 LAB — COMPREHENSIVE METABOLIC PANEL
ALT: 19 IU/L (ref 0–32)
AST: 21 IU/L (ref 0–40)
Albumin/Globulin Ratio: 2 (ref 1.2–2.2)
Albumin: 4.5 g/dL (ref 3.7–4.7)
Alkaline Phosphatase: 50 IU/L (ref 39–117)
BUN/Creatinine Ratio: 22 (ref 12–28)
BUN: 19 mg/dL (ref 8–27)
Bilirubin Total: 0.3 mg/dL (ref 0.0–1.2)
CO2: 24 mmol/L (ref 20–29)
Calcium: 9.5 mg/dL (ref 8.7–10.3)
Chloride: 104 mmol/L (ref 96–106)
Creatinine, Ser: 0.86 mg/dL (ref 0.57–1.00)
GFR calc Af Amer: 75 mL/min/{1.73_m2} (ref >59)
GFR calc non Af Amer: 65 mL/min/{1.73_m2} (ref >59)
Globulin, Total: 2.2 g/dL (ref 1.5–4.5)
Glucose: 66 mg/dL (ref 65–99)
Potassium: 3.7 mmol/L (ref 3.5–5.2)
Sodium: 141 mmol/L (ref 134–144)
Total Protein: 6.7 g/dL (ref 6.0–8.5)

## 2019-09-24 LAB — LIPID PANEL
Chol/HDL Ratio: 2.5 ratio (ref 0.0–4.4)
Cholesterol, Total: 152 mg/dL (ref 100–199)
HDL: 60 mg/dL (ref >39)
LDL Chol Calc (NIH): 72 mg/dL (ref 0–99)
Triglycerides: 109 mg/dL (ref 0–149)
VLDL Cholesterol Cal: 20 mg/dL (ref 5–40)

## 2019-11-20 ENCOUNTER — Encounter: Payer: Self-pay | Admitting: Certified Nurse Midwife

## 2020-03-07 ENCOUNTER — Ambulatory Visit: Payer: Medicare Other | Admitting: Certified Nurse Midwife

## 2020-04-09 DIAGNOSIS — M79645 Pain in left finger(s): Secondary | ICD-10-CM | POA: Diagnosis not present

## 2020-04-09 DIAGNOSIS — T148XXA Other injury of unspecified body region, initial encounter: Secondary | ICD-10-CM | POA: Diagnosis not present

## 2020-04-18 DIAGNOSIS — L282 Other prurigo: Secondary | ICD-10-CM | POA: Diagnosis not present

## 2020-04-18 DIAGNOSIS — L237 Allergic contact dermatitis due to plants, except food: Secondary | ICD-10-CM | POA: Diagnosis not present

## 2020-05-31 DIAGNOSIS — Z23 Encounter for immunization: Secondary | ICD-10-CM | POA: Diagnosis not present

## 2020-06-04 ENCOUNTER — Encounter: Payer: Self-pay | Admitting: Neurology

## 2020-06-04 ENCOUNTER — Ambulatory Visit: Payer: Medicare PPO | Admitting: Neurology

## 2020-06-04 ENCOUNTER — Other Ambulatory Visit: Payer: Self-pay

## 2020-06-04 VITALS — BP 152/78 | HR 73 | Ht 66.0 in | Wt 137.0 lb

## 2020-06-04 DIAGNOSIS — G3184 Mild cognitive impairment, so stated: Secondary | ICD-10-CM

## 2020-06-04 DIAGNOSIS — R9431 Abnormal electrocardiogram [ECG] [EKG]: Secondary | ICD-10-CM

## 2020-06-04 MED ORDER — ALPRAZOLAM 1 MG PO TABS: ORAL_TABLET | ORAL | 0 refills | Status: DC

## 2020-06-04 MED ORDER — DONEPEZIL HCL 10 MG PO TBDP
10.0000 mg | ORAL_TABLET | Freq: Every day | ORAL | 5 refills | Status: DC
Start: 2020-06-04 — End: 2021-12-21

## 2020-06-04 NOTE — Patient Instructions (Signed)
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
## 2020-06-05 ENCOUNTER — Telehealth: Payer: Self-pay | Admitting: Neurology

## 2020-06-05 LAB — COMPREHENSIVE METABOLIC PANEL
ALT: 16 IU/L (ref 0–32)
AST: 16 IU/L (ref 0–40)
Albumin/Globulin Ratio: 1.9 (ref 1.2–2.2)
Albumin: 4.7 g/dL (ref 3.7–4.7)
Alkaline Phosphatase: 64 IU/L (ref 44–121)
BUN/Creatinine Ratio: 20 (ref 12–28)
BUN: 17 mg/dL (ref 8–27)
Bilirubin Total: 0.3 mg/dL (ref 0.0–1.2)
CO2: 23 mmol/L (ref 20–29)
Calcium: 9.6 mg/dL (ref 8.7–10.3)
Chloride: 102 mmol/L (ref 96–106)
Creatinine, Ser: 0.84 mg/dL (ref 0.57–1.00)
GFR calc Af Amer: 77 mL/min/{1.73_m2} (ref >59)
GFR calc non Af Amer: 67 mL/min/{1.73_m2} (ref >59)
Globulin, Total: 2.5 g/dL (ref 1.5–4.5)
Glucose: 93 mg/dL (ref 65–99)
Potassium: 4.6 mmol/L (ref 3.5–5.2)
Sodium: 140 mmol/L (ref 134–144)
Total Protein: 7.2 g/dL (ref 6.0–8.5)

## 2020-06-05 NOTE — Telephone Encounter (Signed)
Called the patient and advised lab work looked great and allows for her to move forward with MRI. She verbalized understanding and had no questions.

## 2020-06-05 NOTE — Telephone Encounter (Signed)
-----   Message from Larey Seat, MD sent at 06/05/2020  4:20 PM EDT ----- Normal CMET panel.

## 2020-06-05 NOTE — Progress Notes (Signed)
Normal CMET panel.

## 2020-06-05 NOTE — Telephone Encounter (Signed)
Savannah Freeman: [id number] (exp. 06/05/20 to 07/05/20) order sent to GI. They will reach out to the patient to schedule.

## 2020-06-09 ENCOUNTER — Ambulatory Visit
Admission: RE | Admit: 2020-06-09 | Discharge: 2020-06-09 | Disposition: A | Discharge status: Home / Self Care | Payer: Medicare PPO | Source: Ambulatory Visit | Attending: Neurology | Admitting: Neurology

## 2020-06-09 DIAGNOSIS — G3184 Mild cognitive impairment, so stated: Secondary | ICD-10-CM

## 2020-06-09 DIAGNOSIS — R9431 Abnormal electrocardiogram [ECG] [EKG]: Secondary | ICD-10-CM

## 2020-06-09 MED ORDER — GADOBENATE DIMEGLUMINE 529 MG/ML IV SOLN
12.0000 mL | Freq: Once | INTRAVENOUS | Status: AC | PRN
  Administered 2020-06-09: 12 mL via INTRAVENOUS

## 2020-06-12 ENCOUNTER — Telehealth: Payer: Self-pay | Admitting: Neurology

## 2020-06-12 ENCOUNTER — Encounter: Payer: Self-pay | Admitting: Neurology

## 2020-06-12 NOTE — Telephone Encounter (Signed)
Called the patient and there was no answer. LVM advising the pt that I would send a mychart message with the results. Advised pt to call back with any questions.

## 2020-06-12 NOTE — Progress Notes (Signed)
IMPRESSION: This MRI of the brain with and without contrast shows the following: 1.   Generalized cortical atrophy that is most pronounced in the medial temporal lobes. 2.   Small chronic stroke in the inferior right frontal lobe.  In the hemispheres, there are some scattered T2/FLAIR hyperintense foci consistent with mild chronic microvascular ischemic changes. 3.   Normal enhancement pattern. 4.   No acute findings.     INTERPRETING PHYSICIAN:  Richard A. Felecia Shelling, MD, PhD, FAAN   Medial temporal lobe is most indicative of Alzheimer pathology.

## 2020-06-12 NOTE — Telephone Encounter (Signed)
-----   Message from Larey Seat, MD sent at 06/12/2020 12:20 PM EDT ----- IMPRESSION: This MRI of the brain with and without contrast shows the following: 1.   Generalized cortical atrophy that is most pronounced in the medial temporal lobes. 2.   Small chronic stroke in the inferior right frontal lobe.  In the hemispheres, there are some scattered T2/FLAIR hyperintense foci consistent with mild chronic microvascular ischemic changes. 3.   Normal enhancement pattern. 4.   No acute findings.     INTERPRETING PHYSICIAN:  Richard A. Felecia Shelling, MD, PhD, FAAN   Medial temporal lobe is most indicative of Alzheimer pathology.

## 2020-06-13 NOTE — Progress Notes (Signed)
After the infusion of contrast material, a normal enhancement pattern is noted.   IMPRESSION: This MRI of the brain with and without contrast shows the following: 1.   Generalized cortical atrophy that is most pronounced in the medial temporal lobes. 2.   Small chronic stroke in the inferior right frontal lobe.  In the hemispheres, there are some scattered T2/FLAIR hyperintense foci consistent with mild chronic microvascular ischemic changes. 3.   Normal enhancement pattern. 4.   No acute findings.     INTERPRETING PHYSICIAN: Dr Felecia Shelling

## 2020-06-30 DIAGNOSIS — M79672 Pain in left foot: Secondary | ICD-10-CM | POA: Diagnosis not present

## 2020-07-21 DIAGNOSIS — I25119 Atherosclerotic heart disease of native coronary artery with unspecified angina pectoris: Secondary | ICD-10-CM | POA: Diagnosis not present

## 2020-07-21 DIAGNOSIS — M1990 Unspecified osteoarthritis, unspecified site: Secondary | ICD-10-CM | POA: Diagnosis not present

## 2020-07-21 DIAGNOSIS — E785 Hyperlipidemia, unspecified: Secondary | ICD-10-CM | POA: Diagnosis not present

## 2020-07-21 DIAGNOSIS — E039 Hypothyroidism, unspecified: Secondary | ICD-10-CM | POA: Diagnosis not present

## 2020-07-21 DIAGNOSIS — D692 Other nonthrombocytopenic purpura: Secondary | ICD-10-CM | POA: Diagnosis not present

## 2020-07-21 DIAGNOSIS — R413 Other amnesia: Secondary | ICD-10-CM | POA: Diagnosis not present

## 2020-07-21 DIAGNOSIS — I119 Hypertensive heart disease without heart failure: Secondary | ICD-10-CM | POA: Diagnosis not present

## 2020-07-21 DIAGNOSIS — M810 Age-related osteoporosis without current pathological fracture: Secondary | ICD-10-CM | POA: Diagnosis not present

## 2020-07-21 DIAGNOSIS — F339 Major depressive disorder, recurrent, unspecified: Secondary | ICD-10-CM | POA: Diagnosis not present
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
## 2020-08-14 ENCOUNTER — Ambulatory Visit: Payer: Medicare PPO | Admitting: Cardiovascular Disease

## 2020-08-14 ENCOUNTER — Encounter: Payer: Self-pay | Admitting: Cardiovascular Disease

## 2020-08-14 ENCOUNTER — Other Ambulatory Visit: Payer: Self-pay

## 2020-08-14 VITALS — BP 126/70 | HR 68 | Ht 66.0 in | Wt 137.0 lb

## 2020-08-14 DIAGNOSIS — E785 Hyperlipidemia, unspecified: Secondary | ICD-10-CM | POA: Diagnosis not present

## 2020-08-14 DIAGNOSIS — I2510 Atherosclerotic heart disease of native coronary artery without angina pectoris: Secondary | ICD-10-CM | POA: Diagnosis not present

## 2020-08-14 NOTE — Patient Instructions (Signed)
Medication Instructions:  No changes *If you need a refill on your cardiac medications before your next appointment, please call your pharmacy*   Lab Work: none If you have labs (blood work) drawn today and your tests are completely normal, you will receive your results only by: Marland Kitchen MyChart Message (if you have MyChart) OR . A paper copy in the mail If you have any lab test that is abnormal or we need to change your treatment, we will call you to review the results.   Testing/Procedures: Your physician has requested that you have an echocardiogram. Echocardiography is a painless test that uses sound waves to create images of your heart. It provides your doctor with information about the size and shape of your heart and how well your heart's chambers and valves are working. This procedure takes approximately one hour. There are no restrictions for this procedure.    Follow-Up: At [facility], you and your health needs are our priority.  As part of our continuing mission to provide you with exceptional heart care, we have created designated Provider Care Teams.  These Care Teams include your primary Cardiologist (physician) and Advanced Practice Providers (APPs -  Physician Assistants and Nurse Practitioners) who all work together to provide you with the care you need, when you need it.  Your next appointment:   12 month(s)  The format for your next appointment:   In Person  Provider:   You may see Lauree Chandler, MD or one of the following Advanced Practice Providers on your designated Care Team:    Melina Copa, PA-C  Ermalinda Barrios, PA-C    Other Instructions

## 2020-09-01 ENCOUNTER — Ambulatory Visit (HOSPITAL_COMMUNITY): Payer: Medicare PPO | Attending: Cardiology

## 2020-09-01 ENCOUNTER — Other Ambulatory Visit: Payer: Self-pay

## 2020-09-01 DIAGNOSIS — Z029 Encounter for administrative examinations, unspecified: Secondary | ICD-10-CM | POA: Diagnosis not present

## 2020-09-01 DIAGNOSIS — R413 Other amnesia: Secondary | ICD-10-CM | POA: Diagnosis not present

## 2020-09-01 DIAGNOSIS — F339 Major depressive disorder, recurrent, unspecified: Secondary | ICD-10-CM | POA: Diagnosis not present

## 2020-09-01 DIAGNOSIS — I2510 Atherosclerotic heart disease of native coronary artery without angina pectoris: Secondary | ICD-10-CM | POA: Insufficient documentation

## 2020-09-01 DIAGNOSIS — M810 Age-related osteoporosis without current pathological fracture: Secondary | ICD-10-CM | POA: Diagnosis not present

## 2020-09-01 DIAGNOSIS — E785 Hyperlipidemia, unspecified: Secondary | ICD-10-CM | POA: Insufficient documentation

## 2020-09-01 DIAGNOSIS — M1990 Unspecified osteoarthritis, unspecified site: Secondary | ICD-10-CM | POA: Diagnosis not present

## 2020-09-01 DIAGNOSIS — I25119 Atherosclerotic heart disease of native coronary artery with unspecified angina pectoris: Secondary | ICD-10-CM | POA: Diagnosis not present

## 2020-09-01 DIAGNOSIS — I119 Hypertensive heart disease without heart failure: Secondary | ICD-10-CM | POA: Diagnosis not present

## 2020-09-01 DIAGNOSIS — E039 Hypothyroidism, unspecified: Secondary | ICD-10-CM | POA: Diagnosis not present

## 2020-09-01 LAB — ECHOCARDIOGRAM COMPLETE
Area-P 1/2: 3.03 cm2
P 1/2 time: 680 msec
S' Lateral: 2.4 cm

## 2020-12-04 ENCOUNTER — Encounter: Payer: Self-pay | Admitting: Adult Health

## 2020-12-04 ENCOUNTER — Ambulatory Visit: Payer: Medicare PPO | Admitting: Adult Health

## 2020-12-04 VITALS — BP 150/76 | HR 75 | Ht 66.0 in | Wt 139.0 lb

## 2020-12-04 DIAGNOSIS — R413 Other amnesia: Secondary | ICD-10-CM | POA: Diagnosis not present

## 2020-12-04 MED ORDER — MEMANTINE HCL 28 X 5 MG & 21 X 10 MG PO TABS: ORAL_TABLET | ORAL | 0 refills | Status: DC

## 2020-12-04 NOTE — Patient Instructions (Signed)
Your Plan:  Continue Aricept  Start Namenda titration pack. Call at the beginning of week 4 for maintenance dose.  If your symptoms worsen or you develop new symptoms please let us know.    Thank you for coming to see [place] at [facility]. I hope we have been able to provide you high quality care today.  You may receive a patient satisfaction survey over the next few weeks. We would appreciate your feedback and comments so that we may continue to improve ourselves and the health of our patients. Memantine Tablets What is this medicine? MEMANTINE (MEM an teen) is used to treat dementia caused by Alzheimer's disease. This medicine may be used for other purposes; ask your health care provider or pharmacist if you have questions. COMMON BRAND NAME(S): Namenda What should I tell my health care provider before I take this medicine? They need to know if you have any of these conditions:  difficulty passing urine  kidney disease  liver disease  seizures  an unusual or allergic reaction to memantine, other medicines, foods, dyes, or preservatives  pregnant or trying to get pregnant  breast-feeding How should I use this medicine? Take this medicine by mouth with a glass of water. Follow the directions on the prescription label. You may take this medicine with or without food. Take your doses at regular intervals. Do not take your medicine more often than directed. Continue to take your medicine even if you feel better. Do not stop taking except on the advice of your doctor or health care professional. Talk to your pediatrician regarding the use of this medicine in children. Special care may be needed. Overdosage: If you think you have taken too much of this medicine contact a poison control center or emergency room at once. NOTE: This medicine is only for you. Do not share this medicine with others. What if I miss a dose? If you miss a dose, take it as soon as you can. If  it is almost time for your next dose, take only that dose. Do not take double or extra doses. If you do not take your medicine for several days, contact your health care provider. Your dose may need to be changed. What may interact with this medicine?  acetazolamide  amantadine  cimetidine  dextromethorphan  dofetilide  hydrochlorothiazide  ketamine  metformin  methazolamide  quinidine  ranitidine  sodium bicarbonate  triamterene This list may not describe all possible interactions. Give your health care provider a list of all the medicines, herbs, non-prescription drugs, or dietary supplements you use. Also tell them if you smoke, drink alcohol, or use illegal drugs. Some items may interact with your medicine. What should I watch for while using this medicine? Visit your doctor or health care professional for regular checks on your progress. Check with your doctor or health care professional if there is no improvement in your symptoms or if they get worse. You may get drowsy or dizzy. Do not drive, use machinery, or do anything that needs mental alertness until you know how this drug affects you. Do not stand or sit up quickly, especially if you are an older patient. This reduces the risk of dizzy or fainting spells. Alcohol can make you more drowsy and dizzy. Avoid alcoholic drinks. What side effects may I notice from receiving this medicine? Side effects that you should report to your doctor or health care professional as soon as possible:  allergic reactions like skin rash, itching or hives,  swelling of the face, lips, or tongue  agitation or a feeling of restlessness  depressed mood  dizziness  hallucinations  redness, blistering, peeling or loosening of the skin, including inside the mouth  seizures  vomiting Side effects that usually do not require medical attention (report to your doctor or health care professional if they continue or are  bothersome):  constipation  diarrhea  headache  nausea  trouble sleeping This list may not describe all possible side effects. Call your doctor for medical advice about side effects. You may report side effects to FDA at 1-800-FDA-1088. Where should I keep my medicine? Keep out of the reach of children. Store at room temperature between 15 degrees and 30 degrees C (59 degrees and 86 degrees F). Throw away any unused medicine after the expiration date. NOTE: This sheet is a summary. It may not cover all possible information. If you have questions about this medicine, talk to your doctor, pharmacist, or health care provider.  2021 Elsevier/Gold Standard (2013-06-04 14:10:42)
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
## 2020-12-05 ENCOUNTER — Telehealth: Payer: Self-pay | Admitting: Adult Health

## 2020-12-05 NOTE — Telephone Encounter (Signed)
Pt's husband Mikki [place], on [place] called needing to speak to the provider or RN regarding her donepezil (ARICEPT ODT) 10 MG disintegrating tablet Please advise.

## 2020-12-08 NOTE — Telephone Encounter (Signed)
I called spoke to husband.  Pt had not been taking the donepezil as ordered.  Has started the memantine titration pack and so will continue on this till maintenance dose and if tolerating ok then will let us know and will then make decision to add donepezil.  He was ok to do this and will let us know if questions/ concerns. If any change to this let me know and will call them back.

## 2020-12-08 NOTE — Telephone Encounter (Signed)
noted 

## 2021-01-27 DIAGNOSIS — E039 Hypothyroidism, unspecified: Secondary | ICD-10-CM | POA: Diagnosis not present

## 2021-01-27 DIAGNOSIS — E785 Hyperlipidemia, unspecified: Secondary | ICD-10-CM | POA: Diagnosis not present

## 2021-01-27 DIAGNOSIS — Z0000 Encounter for general adult medical examination without abnormal findings: Secondary | ICD-10-CM | POA: Diagnosis not present

## 2021-01-27 DIAGNOSIS — M810 Age-related osteoporosis without current pathological fracture: Secondary | ICD-10-CM | POA: Diagnosis not present

## 2021-02-03 DIAGNOSIS — M810 Age-related osteoporosis without current pathological fracture: Secondary | ICD-10-CM | POA: Diagnosis not present

## 2021-02-03 DIAGNOSIS — F339 Major depressive disorder, recurrent, unspecified: Secondary | ICD-10-CM | POA: Diagnosis not present

## 2021-02-03 DIAGNOSIS — I25119 Atherosclerotic heart disease of native coronary artery with unspecified angina pectoris: Secondary | ICD-10-CM | POA: Diagnosis not present

## 2021-02-03 DIAGNOSIS — E871 Hypo-osmolality and hyponatremia: Secondary | ICD-10-CM | POA: Diagnosis not present

## 2021-02-03 DIAGNOSIS — E039 Hypothyroidism, unspecified: Secondary | ICD-10-CM | POA: Diagnosis not present

## 2021-02-03 DIAGNOSIS — Z0000 Encounter for general adult medical examination without abnormal findings: Secondary | ICD-10-CM | POA: Diagnosis not present

## 2021-02-03 DIAGNOSIS — I119 Hypertensive heart disease without heart failure: Secondary | ICD-10-CM | POA: Diagnosis not present

## 2021-02-03 DIAGNOSIS — R413 Other amnesia: Secondary | ICD-10-CM | POA: Diagnosis not present

## 2021-02-03 DIAGNOSIS — E785 Hyperlipidemia, unspecified: Secondary | ICD-10-CM | POA: Diagnosis not present

## 2021-02-08 ENCOUNTER — Other Ambulatory Visit: Payer: Self-pay

## 2021-02-08 ENCOUNTER — Emergency Department (HOSPITAL_COMMUNITY)
Admission: EM | Admit: 2021-02-08 | Discharge: 2021-02-09 | Disposition: A | Discharge status: Home / Self Care | Payer: Medicare PPO | Attending: Emergency Medicine | Admitting: Emergency Medicine

## 2021-02-08 ENCOUNTER — Emergency Department (HOSPITAL_COMMUNITY): Payer: Medicare PPO

## 2021-02-08 DIAGNOSIS — Z79899 Other long term (current) drug therapy: Secondary | ICD-10-CM | POA: Insufficient documentation

## 2021-02-08 DIAGNOSIS — Z7982 Long term (current) use of aspirin: Secondary | ICD-10-CM | POA: Diagnosis not present

## 2021-02-08 DIAGNOSIS — I499 Cardiac arrhythmia, unspecified: Secondary | ICD-10-CM | POA: Diagnosis not present

## 2021-02-08 DIAGNOSIS — Z87891 Personal history of nicotine dependence: Secondary | ICD-10-CM | POA: Diagnosis not present

## 2021-02-08 DIAGNOSIS — I2510 Atherosclerotic heart disease of native coronary artery without angina pectoris: Secondary | ICD-10-CM | POA: Insufficient documentation

## 2021-02-08 DIAGNOSIS — E039 Hypothyroidism, unspecified: Secondary | ICD-10-CM | POA: Diagnosis not present

## 2021-02-08 DIAGNOSIS — R220 Localized swelling, mass and lump, head: Secondary | ICD-10-CM | POA: Insufficient documentation

## 2021-02-08 DIAGNOSIS — R410 Disorientation, unspecified: Secondary | ICD-10-CM | POA: Diagnosis not present

## 2021-02-08 DIAGNOSIS — R55 Syncope and collapse: Secondary | ICD-10-CM | POA: Diagnosis not present

## 2021-02-08 DIAGNOSIS — R4020 Unspecified coma: Secondary | ICD-10-CM | POA: Diagnosis not present

## 2021-02-08 DIAGNOSIS — R231 Pallor: Secondary | ICD-10-CM | POA: Diagnosis not present

## 2021-02-08 LAB — CBC
HCT: 42.6 % (ref 36.0–46.0)
Hemoglobin: 14.3 g/dL (ref 12.0–15.0)
MCH: 29.5 pg (ref 26.0–34.0)
MCHC: 33.6 g/dL (ref 30.0–36.0)
MCV: 87.8 fL (ref 80.0–100.0)
Platelets: 323 10*3/uL (ref 150–400)
RBC: 4.85 MIL/uL (ref 3.87–5.11)
RDW: 12.7 % (ref 11.5–15.5)
WBC: 14 10*3/uL — ABNORMAL HIGH (ref 4.0–10.5)
nRBC: 0 % (ref 0.0–0.2)

## 2021-02-08 LAB — URINALYSIS, MICROSCOPIC (REFLEX): RBC / HPF: NONE SEEN RBC/hpf (ref 0–5)

## 2021-02-08 LAB — BASIC METABOLIC PANEL
Anion gap: 11 (ref 5–15)
BUN: 18 mg/dL (ref 8–23)
CO2: 22 mmol/L (ref 22–32)
Calcium: 9.5 mg/dL (ref 8.9–10.3)
Chloride: 106 mmol/L (ref 98–111)
Creatinine, Ser: 1.25 mg/dL — ABNORMAL HIGH (ref 0.44–1.00)
GFR, Estimated: 44 mL/min — ABNORMAL LOW (ref >60)
Glucose, Bld: 131 mg/dL — ABNORMAL HIGH (ref 70–99)
Potassium: 3.8 mmol/L (ref 3.5–5.1)
Sodium: 139 mmol/L (ref 135–145)

## 2021-02-08 LAB — URINALYSIS, ROUTINE W REFLEX MICROSCOPIC
Glucose, UA: NEGATIVE mg/dL
Hgb urine dipstick: NEGATIVE
Ketones, ur: 15 mg/dL — AB
Nitrite: NEGATIVE
Protein, ur: 100 mg/dL — AB
Specific Gravity, Urine: >1.03 — ABNORMAL HIGH (ref 1.005–1.030)
pH: 5.5 (ref 5.0–8.0)

## 2021-02-08 LAB — CBG MONITORING, ED: Glucose-Capillary: 131 mg/dL — ABNORMAL HIGH (ref 70–99)

## 2021-02-08 MED ORDER — SODIUM CHLORIDE 0.9 % IV BOLUS
500.0000 mL | Freq: Once | INTRAVENOUS | Status: AC
  Administered 2021-02-08: 500 mL via INTRAVENOUS
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
## 2021-02-09 NOTE — ED Provider Notes (Signed)
CT imaging negative Pt feeling improved and ready for d/c home    Ripley Fraise, MD 02/09/21 [phone]

## 2021-02-10 ENCOUNTER — Telehealth: Payer: Self-pay | Admitting: *Deleted

## 2021-02-10 NOTE — Telephone Encounter (Signed)
error 

## 2021-02-11 ENCOUNTER — Other Ambulatory Visit: Payer: Self-pay | Admitting: Internal Medicine

## 2021-02-11 DIAGNOSIS — Z1231 Encounter for screening mammogram for malignant neoplasm of breast: Secondary | ICD-10-CM

## 2021-02-16 ENCOUNTER — Other Ambulatory Visit: Payer: Self-pay

## 2021-02-16 ENCOUNTER — Ambulatory Visit
Admission: RE | Admit: 2021-02-16 | Discharge: 2021-02-16 | Disposition: A | Discharge status: Home / Self Care | Payer: Medicare PPO | Source: Ambulatory Visit | Attending: Internal Medicine | Admitting: Internal Medicine

## 2021-02-16 DIAGNOSIS — Z1231 Encounter for screening mammogram for malignant neoplasm of breast: Secondary | ICD-10-CM | POA: Diagnosis not present

## 2021-03-05 ENCOUNTER — Encounter: Payer: Self-pay | Admitting: Internal Medicine

## 2021-04-06 DIAGNOSIS — R413 Other amnesia: Secondary | ICD-10-CM | POA: Diagnosis not present

## 2021-04-06 DIAGNOSIS — E785 Hyperlipidemia, unspecified: Secondary | ICD-10-CM | POA: Diagnosis not present

## 2021-04-06 DIAGNOSIS — E875 Hyperkalemia: Secondary | ICD-10-CM | POA: Diagnosis not present

## 2021-04-06 DIAGNOSIS — I119 Hypertensive heart disease without heart failure: Secondary | ICD-10-CM | POA: Diagnosis not present

## 2021-04-13 DIAGNOSIS — E785 Hyperlipidemia, unspecified: Secondary | ICD-10-CM | POA: Diagnosis not present

## 2021-06-06 DIAGNOSIS — Z23 Encounter for immunization: Secondary | ICD-10-CM | POA: Diagnosis not present

## 2021-06-10 DIAGNOSIS — Z1339 Encounter for screening examination for other mental health and behavioral disorders: Secondary | ICD-10-CM | POA: Diagnosis not present

## 2021-06-10 DIAGNOSIS — F339 Major depressive disorder, recurrent, unspecified: Secondary | ICD-10-CM | POA: Diagnosis not present

## 2021-06-10 DIAGNOSIS — R413 Other amnesia: Secondary | ICD-10-CM | POA: Diagnosis not present

## 2021-06-10 DIAGNOSIS — E039 Hypothyroidism, unspecified: Secondary | ICD-10-CM | POA: Diagnosis not present

## 2021-06-10 DIAGNOSIS — M1990 Unspecified osteoarthritis, unspecified site: Secondary | ICD-10-CM | POA: Diagnosis not present

## 2021-06-10 DIAGNOSIS — I25119 Atherosclerotic heart disease of native coronary artery with unspecified angina pectoris: Secondary | ICD-10-CM | POA: Diagnosis not present

## 2021-06-10 DIAGNOSIS — E785 Hyperlipidemia, unspecified: Secondary | ICD-10-CM | POA: Diagnosis not present

## 2021-06-10 DIAGNOSIS — I119 Hypertensive heart disease without heart failure: Secondary | ICD-10-CM | POA: Diagnosis not present

## 2021-06-10 DIAGNOSIS — M810 Age-related osteoporosis without current pathological fracture: Secondary | ICD-10-CM | POA: Diagnosis not present

## 2021-09-25 DIAGNOSIS — J069 Acute upper respiratory infection, unspecified: Secondary | ICD-10-CM | POA: Diagnosis not present

## 2021-09-25 DIAGNOSIS — I119 Hypertensive heart disease without heart failure: Secondary | ICD-10-CM | POA: Diagnosis not present

## 2021-09-25 DIAGNOSIS — R0789 Other chest pain: Secondary | ICD-10-CM | POA: Diagnosis not present

## 2021-09-25 DIAGNOSIS — R051 Acute cough: Secondary | ICD-10-CM | POA: Diagnosis not present

## 2021-09-25 DIAGNOSIS — R0981 Nasal congestion: Secondary | ICD-10-CM | POA: Diagnosis not present

## 2021-09-25 DIAGNOSIS — Z1152 Encounter for screening for COVID-19: Secondary | ICD-10-CM | POA: Diagnosis not present

## 2021-10-02 DIAGNOSIS — E039 Hypothyroidism, unspecified: Secondary | ICD-10-CM | POA: Diagnosis not present

## 2021-10-02 DIAGNOSIS — M1990 Unspecified osteoarthritis, unspecified site: Secondary | ICD-10-CM | POA: Diagnosis not present

## 2021-10-02 DIAGNOSIS — F339 Major depressive disorder, recurrent, unspecified: Secondary | ICD-10-CM | POA: Diagnosis not present

## 2021-10-02 DIAGNOSIS — J069 Acute upper respiratory infection, unspecified: Secondary | ICD-10-CM | POA: Diagnosis not present

## 2021-10-02 DIAGNOSIS — R413 Other amnesia: Secondary | ICD-10-CM | POA: Diagnosis not present

## 2021-10-02 DIAGNOSIS — M810 Age-related osteoporosis without current pathological fracture: Secondary | ICD-10-CM | POA: Diagnosis not present

## 2021-10-02 DIAGNOSIS — I119 Hypertensive heart disease without heart failure: Secondary | ICD-10-CM | POA: Diagnosis not present

## 2021-10-02 DIAGNOSIS — E785 Hyperlipidemia, unspecified: Secondary | ICD-10-CM | POA: Diagnosis not present

## 2021-10-02 DIAGNOSIS — I25119 Atherosclerotic heart disease of native coronary artery with unspecified angina pectoris: Secondary | ICD-10-CM | POA: Diagnosis not present

## 2021-11-06 DIAGNOSIS — I119 Hypertensive heart disease without heart failure: Secondary | ICD-10-CM | POA: Diagnosis not present

## 2021-11-06 DIAGNOSIS — G3184 Mild cognitive impairment, so stated: Secondary | ICD-10-CM | POA: Diagnosis not present

## 2021-11-06 DIAGNOSIS — I25119 Atherosclerotic heart disease of native coronary artery with unspecified angina pectoris: Secondary | ICD-10-CM | POA: Diagnosis not present

## 2021-11-06 DIAGNOSIS — E039 Hypothyroidism, unspecified: Secondary | ICD-10-CM | POA: Diagnosis not present

## 2021-11-06 DIAGNOSIS — E785 Hyperlipidemia, unspecified: Secondary | ICD-10-CM | POA: Diagnosis not present

## 2021-11-06 DIAGNOSIS — R5383 Other fatigue: Secondary | ICD-10-CM | POA: Diagnosis not present

## 2021-11-06 DIAGNOSIS — R635 Abnormal weight gain: Secondary | ICD-10-CM | POA: Diagnosis not present

## 2021-12-03 ENCOUNTER — Ambulatory Visit: Payer: Medicare PPO | Admitting: Adult Health

## 2021-12-03 ENCOUNTER — Encounter: Payer: Self-pay | Admitting: Adult Health

## 2021-12-10 ENCOUNTER — Ambulatory Visit: Payer: Medicare PPO | Admitting: Adult Health
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
## 2021-12-21 ENCOUNTER — Ambulatory Visit: Payer: Medicare PPO | Admitting: Physician Assistant

## 2021-12-21 ENCOUNTER — Encounter: Payer: Self-pay | Admitting: Physician Assistant

## 2021-12-21 VITALS — BP 122/60 | HR 65 | Ht 66.0 in | Wt 130.0 lb

## 2021-12-21 DIAGNOSIS — R55 Syncope and collapse: Secondary | ICD-10-CM

## 2021-12-21 DIAGNOSIS — I2510 Atherosclerotic heart disease of native coronary artery without angina pectoris: Secondary | ICD-10-CM | POA: Diagnosis not present

## 2021-12-21 DIAGNOSIS — N179 Acute kidney failure, unspecified: Secondary | ICD-10-CM | POA: Diagnosis not present

## 2021-12-21 DIAGNOSIS — E785 Hyperlipidemia, unspecified: Secondary | ICD-10-CM | POA: Diagnosis not present

## 2021-12-21 LAB — BASIC METABOLIC PANEL
BUN/Creatinine Ratio: 21 (ref 12–28)
BUN: 19 mg/dL (ref 8–27)
CO2: 25 mmol/L (ref 20–29)
Calcium: 9.6 mg/dL (ref 8.7–10.3)
Chloride: 101 mmol/L (ref 96–106)
Creatinine, Ser: 0.89 mg/dL (ref 0.57–1.00)
Glucose: 85 mg/dL (ref 70–99)
Potassium: 4.7 mmol/L (ref 3.5–5.2)
Sodium: 137 mmol/L (ref 134–144)
eGFR: 66 mL/min/{1.73_m2} (ref >59)

## 2021-12-21 LAB — CBC
Hematocrit: 38.5 % (ref 34.0–46.6)
Hemoglobin: 12.5 g/dL (ref 11.1–15.9)
MCH: 29.2 pg (ref 26.6–33.0)
MCHC: 32.5 g/dL (ref 31.5–35.7)
MCV: 90 fL (ref 79–97)
Platelets: 308 10*3/uL (ref 150–450)
RBC: 4.28 x10E6/uL (ref 3.77–5.28)
RDW: 12.8 % (ref 11.7–15.4)
WBC: 7.6 10*3/uL (ref 3.4–10.8)

## 2021-12-21 MED ORDER — ASPIRIN EC 81 MG PO TBEC: 81.0000 mg | DELAYED_RELEASE_TABLET | Freq: Every day | ORAL | 3 refills | Status: AC

## 2021-12-21 NOTE — Addendum Note (Signed)
Addended by: Ulice Brilliant T on: 12/21/2021 10:17 AM ? ? Modules accepted: Orders ? ?

## 2021-12-21 NOTE — Patient Instructions (Signed)
Medication Instructions:  ?START Aspirin '81mg'$  Take 1 tablet once a day  ?*If you need a refill on your cardiac medications before your next appointment, please call your pharmacy* ? ? ?Lab Work: ?TODAY-BMET, CBC ?If you have labs (blood work) drawn today and your tests are completely normal, you will receive your results only by: ?MyChart Message (if you have MyChart) OR ?A paper copy in the mail ?If you have any lab test that is abnormal or we need to change your treatment, we will call you to review the results. ? ? ?Testing/Procedures: ?NONE ORDERED ? ? ?Follow-Up: ?At [facility], you and your health needs are our priority.  As part of our continuing mission to provide you with exceptional heart care, we have created designated Provider Care Teams.  These Care Teams include your primary Cardiologist (physician) and Advanced Practice Providers (APPs -  Physician Assistants and Nurse Practitioners) who all work together to provide you with the care you need, when you need it. ? ?We recommend signing up for the patient portal called "MyChart".  Sign up information is provided on this After Visit Summary.  MyChart is used to connect with patients for Virtual Visits (Telemedicine).  Patients are able to view lab/test results, encounter notes, upcoming appointments, etc.  Non-urgent messages can be sent to your provider as well.   ?To learn more about what you can do with MyChart, go to [email or web address].   ? ?Your next appointment:   ?1 year(s) ? ?The format for your next appointment:   ?In Person ? ?Provider:   ?Lauree Chandler, MD   ? ? ?Other Instructions ? ? ?Important Information About Sugar ? ? ? ? ? STa ?

## 2022-02-25 DIAGNOSIS — R5383 Other fatigue: Secondary | ICD-10-CM | POA: Diagnosis not present

## 2022-02-25 DIAGNOSIS — R7989 Other specified abnormal findings of blood chemistry: Secondary | ICD-10-CM | POA: Diagnosis not present

## 2022-02-25 DIAGNOSIS — M810 Age-related osteoporosis without current pathological fracture: Secondary | ICD-10-CM | POA: Diagnosis not present

## 2022-02-25 DIAGNOSIS — E039 Hypothyroidism, unspecified: Secondary | ICD-10-CM | POA: Diagnosis not present

## 2022-02-25 DIAGNOSIS — E785 Hyperlipidemia, unspecified: Secondary | ICD-10-CM | POA: Diagnosis not present

## 2022-03-04 ENCOUNTER — Other Ambulatory Visit: Payer: Self-pay | Admitting: Internal Medicine

## 2022-03-04 DIAGNOSIS — E785 Hyperlipidemia, unspecified: Secondary | ICD-10-CM | POA: Diagnosis not present

## 2022-03-04 DIAGNOSIS — R82998 Other abnormal findings in urine: Secondary | ICD-10-CM | POA: Diagnosis not present

## 2022-03-04 DIAGNOSIS — G3184 Mild cognitive impairment, so stated: Secondary | ICD-10-CM | POA: Diagnosis not present

## 2022-03-04 DIAGNOSIS — Z0000 Encounter for general adult medical examination without abnormal findings: Secondary | ICD-10-CM | POA: Diagnosis not present

## 2022-03-04 DIAGNOSIS — E039 Hypothyroidism, unspecified: Secondary | ICD-10-CM | POA: Diagnosis not present

## 2022-03-04 DIAGNOSIS — R5383 Other fatigue: Secondary | ICD-10-CM | POA: Diagnosis not present

## 2022-03-04 DIAGNOSIS — Z23 Encounter for immunization: Secondary | ICD-10-CM | POA: Diagnosis not present

## 2022-03-04 DIAGNOSIS — I119 Hypertensive heart disease without heart failure: Secondary | ICD-10-CM | POA: Diagnosis not present

## 2022-03-04 DIAGNOSIS — Z1231 Encounter for screening mammogram for malignant neoplasm of breast: Secondary | ICD-10-CM

## 2022-03-04 DIAGNOSIS — I25119 Atherosclerotic heart disease of native coronary artery with unspecified angina pectoris: Secondary | ICD-10-CM | POA: Diagnosis not present

## 2022-03-04 DIAGNOSIS — M810 Age-related osteoporosis without current pathological fracture: Secondary | ICD-10-CM | POA: Diagnosis not present

## 2022-03-04 DIAGNOSIS — R635 Abnormal weight gain: Secondary | ICD-10-CM | POA: Diagnosis not present

## 2022-03-13 IMAGING — MG MM DIGITAL SCREENING BILAT W/ TOMO AND CAD
8 series · 9 of 24 positions shown · non-contrast
Comparison: Previous exam(s).

CLINICAL DATA: Screening.

EXAM:
DIGITAL SCREENING BILATERAL MAMMOGRAM WITH TOMOSYNTHESIS AND CAD
TECHNIQUE: Bilateral screening digital craniocaudal and mediolateral oblique
mammograms were obtained. Bilateral screening digital breast
tomosynthesis was performed. The images were evaluated with
computer-aided detection.

[L CC synth-2D]
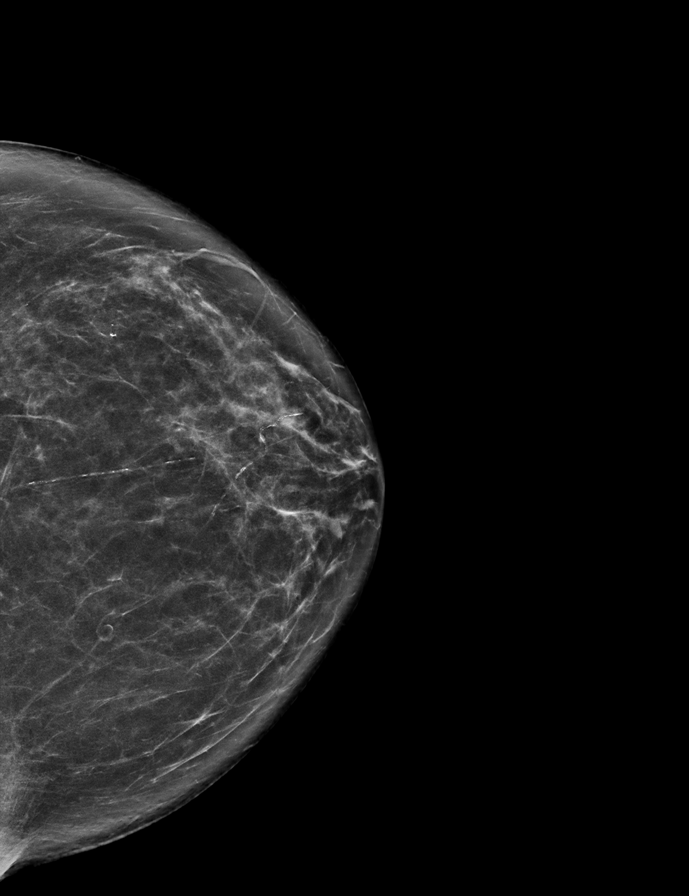

[R CC synth-2D]
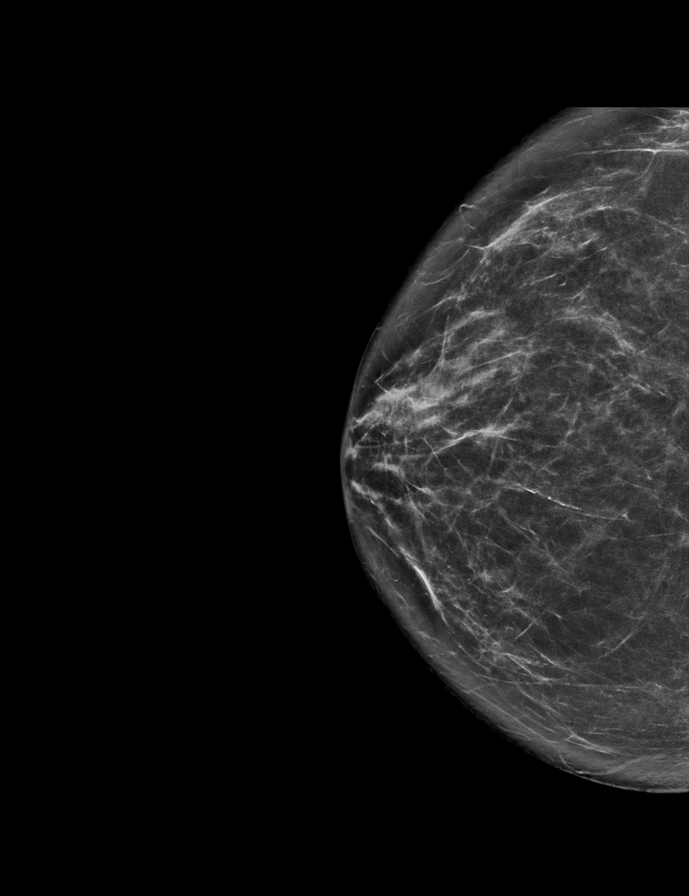

[R MLO synth-2D]
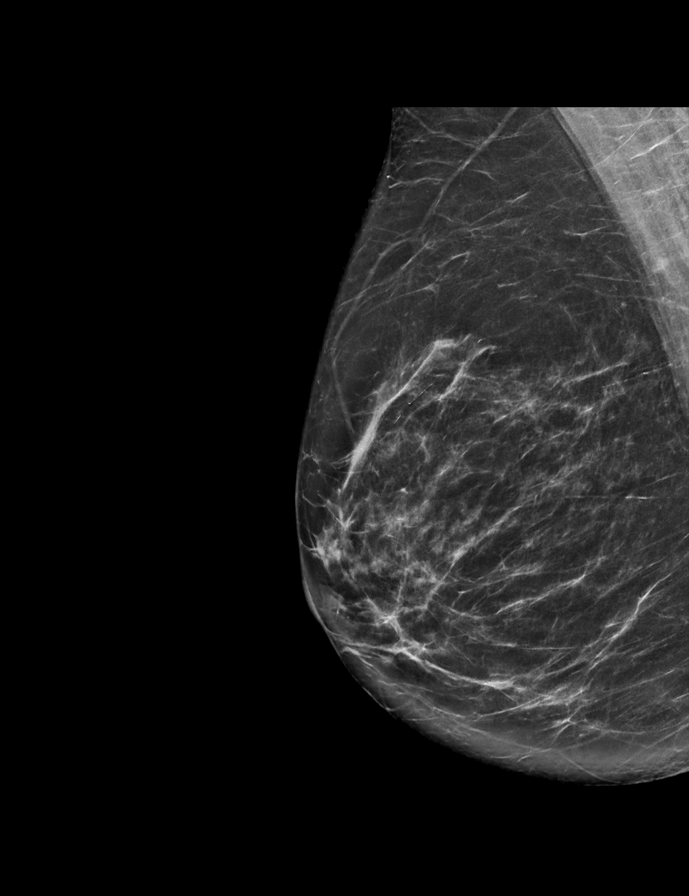

[L MLO synth-2D]
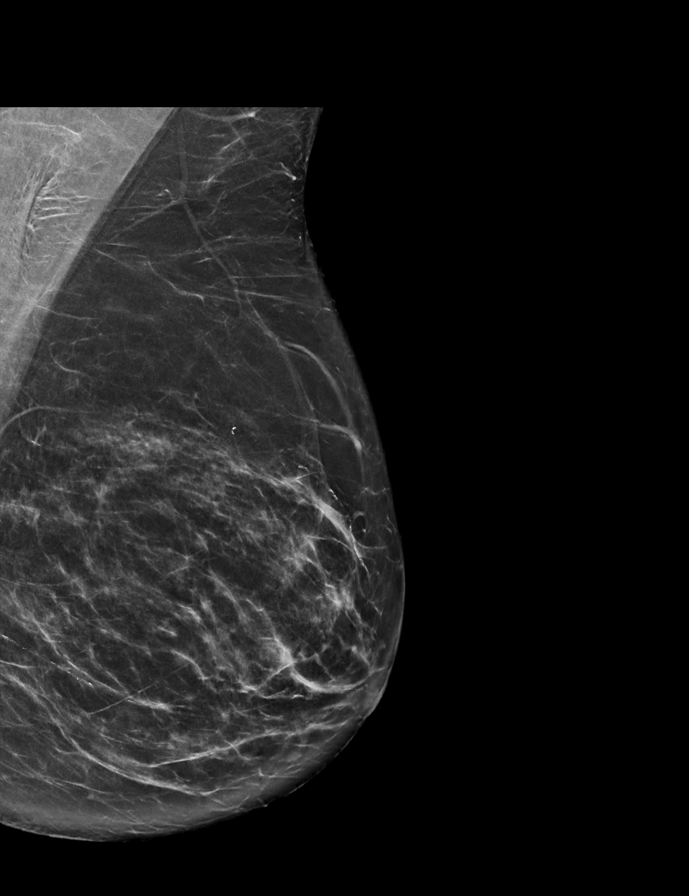

[L CC tomo · 2 of 71 frames shown]
[frame 23/71]
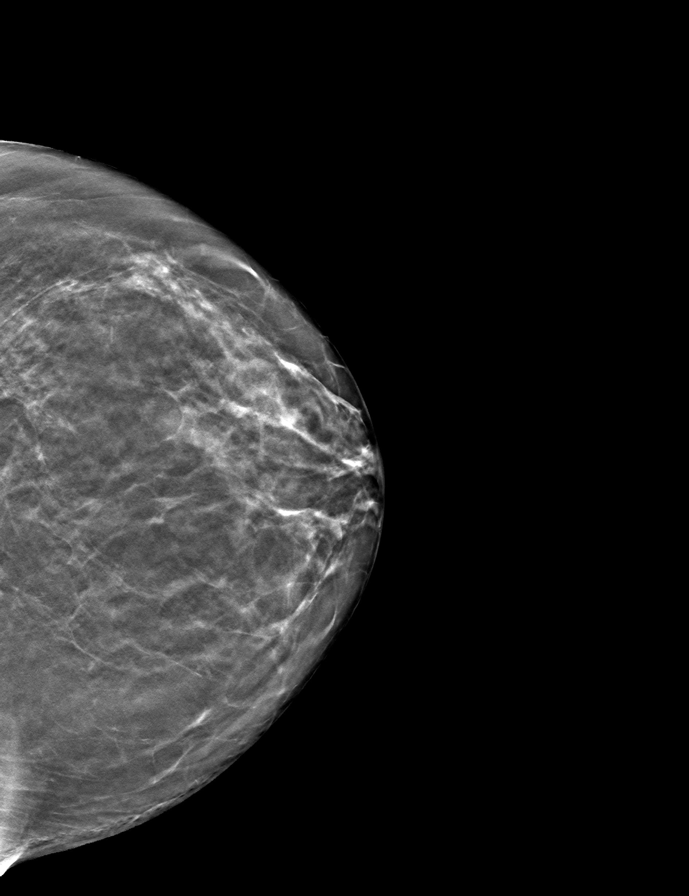
[frame 36/71]
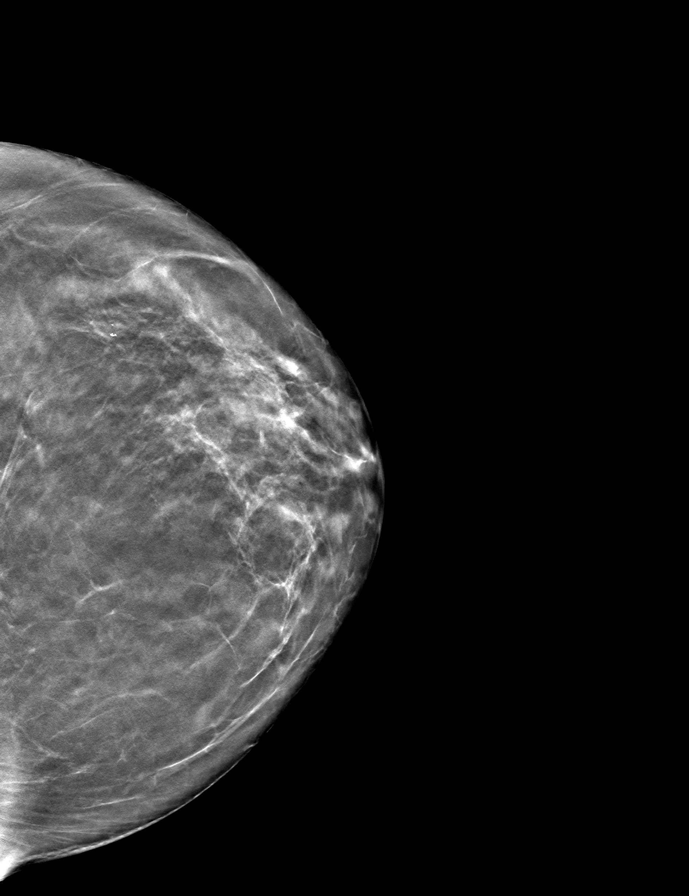

[R MLO tomo · tomo slice 38/75.0]
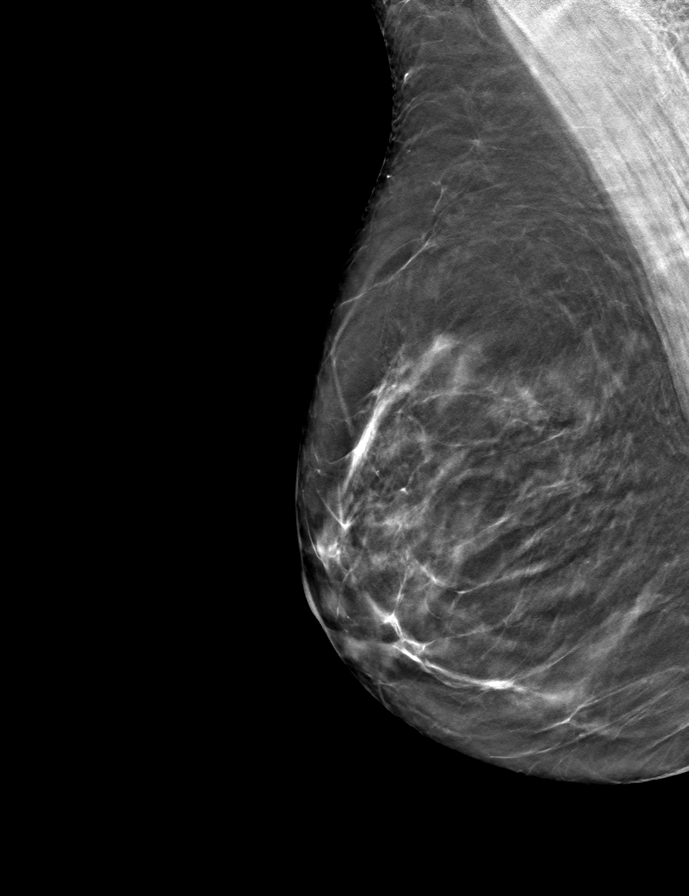

[L MLO tomo · tomo slice 37/74.0]
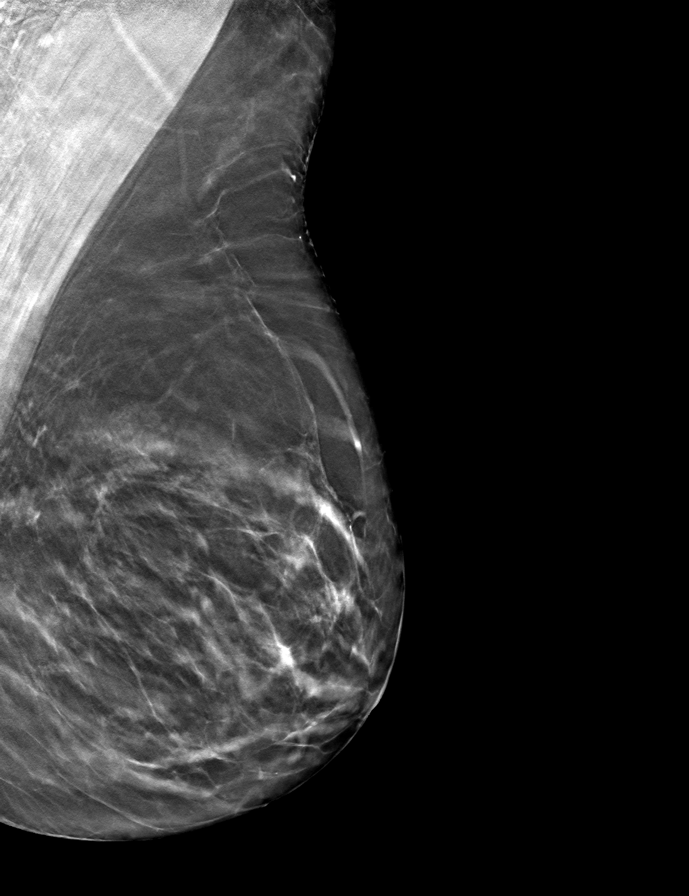

[R CC tomo · tomo slice 35/68.0]
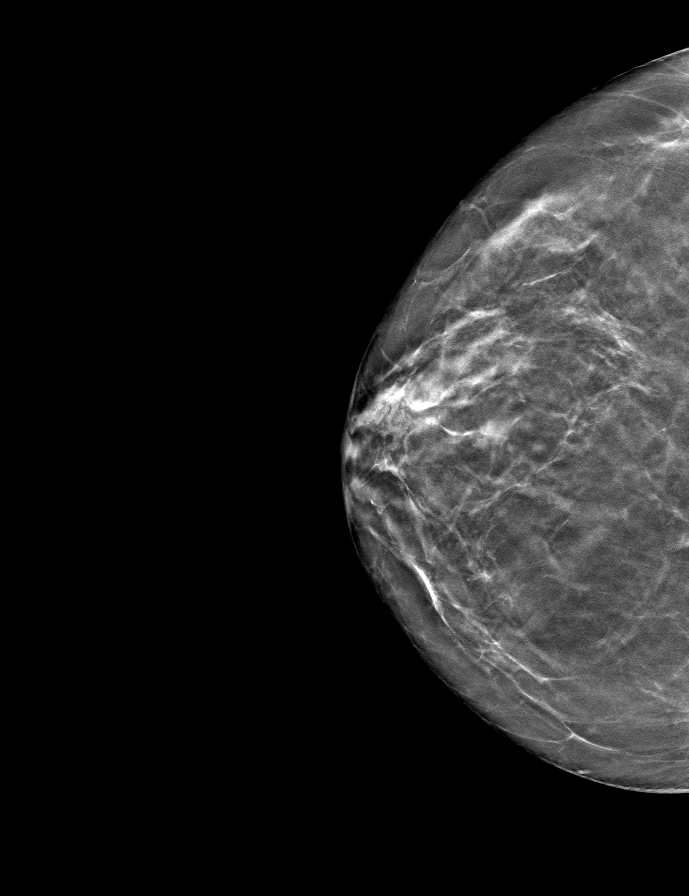

[9 of 24 positions shown; findings below may reference images not displayed]

ACR Breast Density Category b: There are scattered areas of
fibroglandular density.
FINDINGS: There are no findings suspicious for malignancy.
IMPRESSION: No mammographic evidence of malignancy. A result letter of this
screening mammogram will be mailed directly to the patient.

RECOMMENDATION:
Screening mammogram in one year. (Code:51-O-LD2)

BI-RADS CATEGORY  1: Negative.

## 2022-03-24 ENCOUNTER — Ambulatory Visit: Payer: Medicare PPO
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
## 2022-03-29 ENCOUNTER — Ambulatory Visit: Payer: Medicare PPO | Admitting: Adult Health

## 2022-03-29 ENCOUNTER — Encounter: Payer: Self-pay | Admitting: Adult Health

## 2022-03-29 VITALS — BP 115/58 | HR 69 | Ht 66.0 in | Wt 131.2 lb

## 2022-03-29 DIAGNOSIS — R413 Other amnesia: Secondary | ICD-10-CM

## 2022-04-09 ENCOUNTER — Ambulatory Visit
Admission: RE | Admit: 2022-04-09 | Discharge: 2022-04-09 | Disposition: A | Discharge status: Home / Self Care | Payer: Medicare PPO | Source: Ambulatory Visit | Attending: Internal Medicine | Admitting: Internal Medicine

## 2022-04-09 DIAGNOSIS — Z1231 Encounter for screening mammogram for malignant neoplasm of breast: Secondary | ICD-10-CM

## 2022-06-12 DIAGNOSIS — Z23 Encounter for immunization: Secondary | ICD-10-CM | POA: Diagnosis not present

## 2022-07-21 DIAGNOSIS — E785 Hyperlipidemia, unspecified: Secondary | ICD-10-CM | POA: Diagnosis not present

## 2022-07-21 DIAGNOSIS — M810 Age-related osteoporosis without current pathological fracture: Secondary | ICD-10-CM | POA: Diagnosis not present

## 2022-07-21 DIAGNOSIS — R5383 Other fatigue: Secondary | ICD-10-CM | POA: Diagnosis not present

## 2022-07-21 DIAGNOSIS — I119 Hypertensive heart disease without heart failure: Secondary | ICD-10-CM | POA: Diagnosis not present

## 2022-07-21 DIAGNOSIS — E039 Hypothyroidism, unspecified: Secondary | ICD-10-CM | POA: Diagnosis not present

## 2022-07-21 DIAGNOSIS — F339 Major depressive disorder, recurrent, unspecified: Secondary | ICD-10-CM | POA: Diagnosis not present

## 2022-07-21 DIAGNOSIS — I25119 Atherosclerotic heart disease of native coronary artery with unspecified angina pectoris: Secondary | ICD-10-CM | POA: Diagnosis not present

## 2022-07-21 DIAGNOSIS — G3184 Mild cognitive impairment, so stated: Secondary | ICD-10-CM | POA: Diagnosis not present

## 2022-07-21 DIAGNOSIS — R635 Abnormal weight gain: Secondary | ICD-10-CM | POA: Diagnosis not present

## 2023-02-17 DIAGNOSIS — K080 Exfoliation of teeth due to systemic causes: Secondary | ICD-10-CM | POA: Diagnosis not present

## 2023-03-08 DIAGNOSIS — M2042 Other hammer toe(s) (acquired), left foot: Secondary | ICD-10-CM | POA: Diagnosis not present

## 2023-03-08 DIAGNOSIS — M2041 Other hammer toe(s) (acquired), right foot: Secondary | ICD-10-CM | POA: Diagnosis not present

## 2023-03-08 DIAGNOSIS — M79672 Pain in left foot: Secondary | ICD-10-CM | POA: Diagnosis not present

## 2023-03-08 DIAGNOSIS — M79671 Pain in right foot: Secondary | ICD-10-CM | POA: Diagnosis not present

## 2023-03-08 DIAGNOSIS — B351 Tinea unguium: Secondary | ICD-10-CM | POA: Diagnosis not present

## 2023-04-01 DIAGNOSIS — M810 Age-related osteoporosis without current pathological fracture: Secondary | ICD-10-CM | POA: Diagnosis not present

## 2023-04-01 DIAGNOSIS — E039 Hypothyroidism, unspecified: Secondary | ICD-10-CM | POA: Diagnosis not present

## 2023-04-01 DIAGNOSIS — E785 Hyperlipidemia, unspecified: Secondary | ICD-10-CM | POA: Diagnosis not present

## 2023-04-04 DIAGNOSIS — Z1389 Encounter for screening for other disorder: Secondary | ICD-10-CM | POA: Diagnosis not present

## 2023-04-04 DIAGNOSIS — R413 Other amnesia: Secondary | ICD-10-CM | POA: Diagnosis not present

## 2023-04-04 DIAGNOSIS — Z0000 Encounter for general adult medical examination without abnormal findings: Secondary | ICD-10-CM | POA: Diagnosis not present

## 2023-04-08 DIAGNOSIS — Z1339 Encounter for screening examination for other mental health and behavioral disorders: Secondary | ICD-10-CM | POA: Diagnosis not present

## 2023-04-08 DIAGNOSIS — F339 Major depressive disorder, recurrent, unspecified: Secondary | ICD-10-CM | POA: Diagnosis not present

## 2023-04-08 DIAGNOSIS — Z1331 Encounter for screening for depression: Secondary | ICD-10-CM | POA: Diagnosis not present

## 2023-04-08 DIAGNOSIS — Z0000 Encounter for general adult medical examination without abnormal findings: Secondary | ICD-10-CM | POA: Diagnosis not present

## 2023-06-06 ENCOUNTER — Other Ambulatory Visit: Payer: Self-pay | Admitting: Internal Medicine

## 2023-06-06 DIAGNOSIS — Z1231 Encounter for screening mammogram for malignant neoplasm of breast: Secondary | ICD-10-CM

## 2023-07-05 ENCOUNTER — Ambulatory Visit
Admission: RE | Admit: 2023-07-05 | Discharge: 2023-07-05 | Disposition: A | Discharge status: Home / Self Care | Payer: Medicare Other | Source: Ambulatory Visit | Attending: Internal Medicine | Admitting: Internal Medicine

## 2023-07-05 DIAGNOSIS — Z1231 Encounter for screening mammogram for malignant neoplasm of breast: Secondary | ICD-10-CM

## 2023-10-11 DIAGNOSIS — I10 Essential (primary) hypertension: Secondary | ICD-10-CM | POA: Diagnosis not present

## 2024-04-10 DIAGNOSIS — M810 Age-related osteoporosis without current pathological fracture: Secondary | ICD-10-CM | POA: Diagnosis not present

## 2024-04-10 DIAGNOSIS — E785 Hyperlipidemia, unspecified: Secondary | ICD-10-CM | POA: Diagnosis not present

## 2024-04-10 DIAGNOSIS — D649 Anemia, unspecified: Secondary | ICD-10-CM | POA: Diagnosis not present

## 2024-04-10 DIAGNOSIS — E039 Hypothyroidism, unspecified: Secondary | ICD-10-CM | POA: Diagnosis not present

## 2024-04-16 DIAGNOSIS — Z1331 Encounter for screening for depression: Secondary | ICD-10-CM | POA: Diagnosis not present

## 2024-04-16 DIAGNOSIS — Z0000 Encounter for general adult medical examination without abnormal findings: Secondary | ICD-10-CM | POA: Diagnosis not present

## 2024-04-16 DIAGNOSIS — Z1339 Encounter for screening examination for other mental health and behavioral disorders: Secondary | ICD-10-CM | POA: Diagnosis not present

## 2024-04-17 DIAGNOSIS — R82998 Other abnormal findings in urine: Secondary | ICD-10-CM | POA: Diagnosis not present

## 2024-04-17 DIAGNOSIS — I10 Essential (primary) hypertension: Secondary | ICD-10-CM | POA: Diagnosis not present

## 2024-06-12 ENCOUNTER — Other Ambulatory Visit: Payer: Self-pay | Admitting: Internal Medicine

## 2024-06-12 DIAGNOSIS — Z1231 Encounter for screening mammogram for malignant neoplasm of breast: Secondary | ICD-10-CM

## 2024-07-12 ENCOUNTER — Ambulatory Visit
Admission: RE | Admit: 2024-07-12 | Discharge: 2024-07-12 | Disposition: A | Source: Ambulatory Visit | Attending: Internal Medicine | Admitting: Internal Medicine

## 2024-07-12 DIAGNOSIS — Z1231 Encounter for screening mammogram for malignant neoplasm of breast: Secondary | ICD-10-CM | POA: Diagnosis not present

## 2024-07-17 ENCOUNTER — Other Ambulatory Visit: Payer: Self-pay | Admitting: Internal Medicine

## 2024-07-17 DIAGNOSIS — R928 Other abnormal and inconclusive findings on diagnostic imaging of breast: Secondary | ICD-10-CM

## 2024-08-06 ENCOUNTER — Ambulatory Visit

## 2024-08-06 ENCOUNTER — Ambulatory Visit
Admission: RE | Admit: 2024-08-06 | Discharge: 2024-08-06 | Disposition: A | Source: Ambulatory Visit | Attending: Internal Medicine | Admitting: Internal Medicine

## 2024-08-06 DIAGNOSIS — R928 Other abnormal and inconclusive findings on diagnostic imaging of breast: Secondary | ICD-10-CM
